# Patient Record
Sex: Female | Born: 1935 | Race: Black or African American | Hispanic: No | Smoking: Never smoker
Health system: Southern US, Community
[De-identification: ages and names within clinical notes are randomized; demographics above are authoritative.]

## PROBLEM LIST (undated history)

## (undated) DIAGNOSIS — T7840XA Allergy, unspecified, initial encounter: Secondary | ICD-10-CM

## (undated) DIAGNOSIS — E119 Type 2 diabetes mellitus without complications: Secondary | ICD-10-CM

## (undated) DIAGNOSIS — I10 Essential (primary) hypertension: Secondary | ICD-10-CM

## (undated) DIAGNOSIS — N182 Chronic kidney disease, stage 2 (mild): Secondary | ICD-10-CM

## (undated) DIAGNOSIS — M810 Age-related osteoporosis without current pathological fracture: Secondary | ICD-10-CM

## (undated) DIAGNOSIS — F0390 Unspecified dementia without behavioral disturbance: Secondary | ICD-10-CM

## (undated) DIAGNOSIS — E559 Vitamin D deficiency, unspecified: Secondary | ICD-10-CM

## (undated) DIAGNOSIS — R4182 Altered mental status, unspecified: Secondary | ICD-10-CM

## (undated) HISTORY — DX: Allergy, unspecified, initial encounter: T78.40XA

## (undated) HISTORY — DX: Age-related osteoporosis without current pathological fracture: M81.0

## (undated) HISTORY — DX: Unspecified dementia without behavioral disturbance: F03.90

## (undated) HISTORY — DX: Vitamin D deficiency, unspecified: E55.9

## (undated) HISTORY — DX: Essential (primary) hypertension: I10

## (undated) HISTORY — DX: Chronic kidney disease, stage 2 (mild): N18.2

## (undated) HISTORY — DX: Type 2 diabetes mellitus without complications: E11.9

## (undated) HISTORY — DX: Altered mental status, unspecified: R41.82

---

## 2015-03-07 DIAGNOSIS — F02818 Dementia in other diseases classified elsewhere, unspecified severity, with other behavioral disturbance: Secondary | ICD-10-CM | POA: Insufficient documentation

## 2015-03-07 DIAGNOSIS — F0390 Unspecified dementia without behavioral disturbance: Secondary | ICD-10-CM

## 2015-03-07 DIAGNOSIS — G309 Alzheimer's disease, unspecified: Secondary | ICD-10-CM

## 2015-03-07 DIAGNOSIS — R4182 Altered mental status, unspecified: Secondary | ICD-10-CM

## 2015-03-07 DIAGNOSIS — F0281 Dementia in other diseases classified elsewhere with behavioral disturbance: Secondary | ICD-10-CM | POA: Insufficient documentation

## 2015-03-07 HISTORY — DX: Unspecified dementia, unspecified severity, without behavioral disturbance, psychotic disturbance, mood disturbance, and anxiety: F03.90

## 2015-03-07 HISTORY — DX: Altered mental status, unspecified: R41.82

## 2015-11-23 ENCOUNTER — Ambulatory Visit (INDEPENDENT_AMBULATORY_CARE_PROVIDER_SITE_OTHER): Payer: Medicare PPO | Admitting: Family Medicine

## 2015-11-23 ENCOUNTER — Encounter: Payer: Self-pay | Admitting: Family Medicine

## 2015-11-23 VITALS — BP 161/79 | HR 68 | Temp 98.4°F | Resp 18 | Ht 63.5 in | Wt 125.8 lb

## 2015-11-23 DIAGNOSIS — T7840XA Allergy, unspecified, initial encounter: Secondary | ICD-10-CM

## 2015-11-23 DIAGNOSIS — N182 Chronic kidney disease, stage 2 (mild): Secondary | ICD-10-CM

## 2015-11-23 DIAGNOSIS — M810 Age-related osteoporosis without current pathological fracture: Secondary | ICD-10-CM

## 2015-11-23 DIAGNOSIS — I10 Essential (primary) hypertension: Secondary | ICD-10-CM

## 2015-11-23 DIAGNOSIS — E119 Type 2 diabetes mellitus without complications: Secondary | ICD-10-CM

## 2015-11-23 DIAGNOSIS — F0390 Unspecified dementia without behavioral disturbance: Secondary | ICD-10-CM

## 2015-11-23 LAB — CBC WITH DIFFERENTIAL/PLATELET
BASOS ABS: 0 10*3/uL (ref 0.0–0.1)
Basophils Relative: 0.5 % (ref 0.0–3.0)
EOS ABS: 0.4 10*3/uL (ref 0.0–0.7)
Eosinophils Relative: 6.5 % — ABNORMAL HIGH (ref 0.0–5.0)
HEMATOCRIT: 37.7 % (ref 36.0–46.0)
HEMOGLOBIN: 12.8 g/dL (ref 12.0–15.0)
LYMPHS PCT: 20.4 % (ref 12.0–46.0)
Lymphs Abs: 1.3 10*3/uL (ref 0.7–4.0)
MCHC: 33.9 g/dL (ref 30.0–36.0)
MCV: 88.7 fl (ref 78.0–100.0)
MONO ABS: 0.6 10*3/uL (ref 0.1–1.0)
Monocytes Relative: 8.9 % (ref 3.0–12.0)
Neutro Abs: 4 10*3/uL (ref 1.4–7.7)
Neutrophils Relative %: 63.7 % (ref 43.0–77.0)
Platelets: 211 10*3/uL (ref 150.0–400.0)
RBC: 4.24 Mil/uL (ref 3.87–5.11)
RDW: 13.8 % (ref 11.5–15.5)
WBC: 6.3 10*3/uL (ref 4.0–10.5)

## 2015-11-23 LAB — COMPREHENSIVE METABOLIC PANEL
ALBUMIN: 3.9 g/dL (ref 3.5–5.2)
ALK PHOS: 50 U/L (ref 39–117)
ALT: 9 U/L (ref 0–35)
AST: 19 U/L (ref 0–37)
BUN: 16 mg/dL (ref 6–23)
CO2: 32 mEq/L (ref 19–32)
CREATININE: 1 mg/dL (ref 0.40–1.20)
Calcium: 8.9 mg/dL (ref 8.4–10.5)
Chloride: 103 mEq/L (ref 96–112)
GFR: 68 mL/min (ref >60.00)
GLUCOSE: 93 mg/dL (ref 70–99)
POTASSIUM: 4 meq/L (ref 3.5–5.1)
SODIUM: 141 meq/L (ref 135–145)
TOTAL PROTEIN: 7 g/dL (ref 6.0–8.3)
Total Bilirubin: 0.4 mg/dL (ref 0.2–1.2)

## 2015-11-23 LAB — HEMOGLOBIN A1C: HEMOGLOBIN A1C: 6 % (ref 4.6–6.5)

## 2015-11-23 NOTE — Patient Instructions (Signed)
It was a pleasure meeting you today.  I will attempt to get all your records over the next few days.  We will get labs to today.  I will want to see you next week.  Continue taking the lisinopril you have until I get records.

## 2015-11-23 NOTE — Progress Notes (Signed)
Patient ID: Caroline Wallace, female  DOB: [birth year], 82 y.o.   MRN: [id number] Patient Care Team    Relationship Specialty Notifications Start End  Ma Hillock, DO PCP - General Family Medicine  11/23/15     Subjective:  Caroline Wallace is a 82 y.o.  female present for new patient establishment. All past medical history, surgical history, allergies, family history, immunizations, medications and social history were placed in the electronic medical record today. All recent labs, ED visits and hospitalizations within the last year were reviewed.   Patient presents with her God daughter, which is with whom she now lives since the beginning of this week. She was living with a cousin in [place], [place]. She comes in with very limited history and confusion on medications. She has two medications with her, lisinopril and metformin, both bottles over 1 years old. After pts OV today I was able to obtain last OV from her prior PCP.  Last OV at prior PCP dated 09/30/2015 was for altered  mental status/dementis new onset. Her BP at that time was normal. Her God daughter has been giving her the out of date lisinopril 30 mg the last few days, and her BP is above goal today. She performed 21/30 MMSE, referred to neurology and had a head CT with chronic changes of atrophy and  Microvascular ischemia, no acute process. She was treated for presumed UTI with Cipro. Labs cbc, cmp, tsh, UA and C/S were WNL. APS was called for the pt, uncertain if any follow through. This is likely why she is relocating to [place], and now living with her god daughter.   Medication list from that OV note included: Compliance is uncertain at this time on any of these medications.  Fosamax 70 mg q week.--> uncertain start date. Atenolol-chlorthalidone 50-25  --> pt to return to clinic in 1 week to recheck after start of lisinopril, if elevated will add agent at that time.   Asa 81--> advised to continue.  Claritin 10 mg QD-->  refilled today Metformin ER 500 mg 1 PO QD--> E0F 6.0, uncertain if she is needing this script or if she had been taking since old script with her today.  Os-cal 500 + D3--> advised to continue  simvastatin 20 mg QD --> Will obtain fasting lipids.  Vit D3 5000 u QD--> Vit d needs tested, would lower dose to < 4000u daily.  Lisinopril 30 mg QD--> called in new script today.   Health maintenance: Unknown history.  Assistive device: None Oxygen HQR:FXJO Patient has a Dental home. Hospitalizations/ED visits:  None   There is no immunization history on file for this patient.   Past Medical History:  Diagnosis Date  . Allergy   . Altered mental status 2017  . CKD (chronic kidney disease), stage II   . Dementia 2017  . Diabetes mellitus without complication ([place])   . Hypertension   . Osteoporosis    Allergies  Allergen Reactions  . Penicillins Hives  . Fruit & Vegetable Daily [Nutritional Supplements] Rash   History reviewed. No pertinent surgical history. Family History  Problem Relation Age of Onset  . Lung cancer Mother    Social History   Social History  . Marital status: Widowed    Spouse name: N/A  . Number of children: N/A  . Years of education: N/A   Occupational History  . Not on file.   Social History Main Topics  . Smoking status: Never Smoker  .  Smokeless tobacco: Never Used  . Alcohol use No  . Drug use: No  . Sexual activity: No   Other Topics Concern  . Not on file   Social History Narrative   Widowed.    [employer].    Lives with her God daughter   HS diploma. Retired Primary school teacher.    No ETOH, Tobacco or drug use.    Wears her seatbelt. Smoke detector in the home.      Medication List       Accurate as of 11/23/15 11:59 PM. Always use your most recent med list.          lisinopril 30 MG tablet Commonly known as:  PRINIVIL,ZESTRIL Take 1 tablet (30 mg total) by mouth daily.   loratadine 10 MG tablet Commonly known as:   CLARITIN Take 1 tablet (10 mg total) by mouth daily.   metFORMIN 500 MG 24 hr tablet Commonly known as:  GLUCOPHAGE-XR Take 500 mg by mouth daily with breakfast.        Recent Results (from the past 2160 hour(s))  CBC w/Diff     Status: Abnormal   Collection Time: 11/23/15  2:57 PM  Result Value Ref Range   WBC 6.3 4.0 - 10.5 K/uL   RBC 4.24 3.87 - 5.11 Mil/uL   Hemoglobin 12.8 12.0 - 15.0 g/dL   HCT 37.7 36.0 - 46.0 %   MCV 88.7 78.0 - 100.0 fl   MCHC 33.9 30.0 - 36.0 g/dL   RDW 13.8 11.5 - 15.5 %   Platelets 211.0 150.0 - 400.0 K/uL   Neutrophils Relative % 63.7 43.0 - 77.0 %   Lymphocytes Relative 20.4 12.0 - 46.0 %   Monocytes Relative 8.9 3.0 - 12.0 %   Eosinophils Relative 6.5 (H) 0.0 - 5.0 %   Basophils Relative 0.5 0.0 - 3.0 %   Neutro Abs 4.0 1.4 - 7.7 K/uL   Lymphs Abs 1.3 0.7 - 4.0 K/uL   Monocytes Absolute 0.6 0.1 - 1.0 K/uL   Eosinophils Absolute 0.4 0.0 - 0.7 K/uL   Basophils Absolute 0.0 0.0 - 0.1 K/uL  HgB A1c     Status: None   Collection Time: 11/23/15  2:57 PM  Result Value Ref Range   Hgb A1c MFr Bld 6.0 4.6 - 6.5 %    Comment: Glycemic Control Guidelines for People with Diabetes:Non Diabetic:  <6%Goal of Therapy: <7%Additional Action Suggested:  >8%   Comp Met (CMET)     Status: None   Collection Time: 11/23/15  2:57 PM  Result Value Ref Range   Sodium 141 135 - 145 mEq/L   Potassium 4.0 3.5 - 5.1 mEq/L   Chloride 103 96 - 112 mEq/L   CO2 32 19 - 32 mEq/L   Glucose, Bld 93 70 - 99 mg/dL   BUN 16 6 - 23 mg/dL   Creatinine, Ser 1.00 0.40 - 1.20 mg/dL   Total Bilirubin 0.4 0.2 - 1.2 mg/dL   Alkaline Phosphatase 50 39 - 117 U/L   AST 19 0 - 37 U/L   ALT 9 0 - 35 U/L   Total Protein 7.0 6.0 - 8.3 g/dL   Albumin 3.9 3.5 - 5.2 g/dL   Calcium 8.9 8.4 - 10.5 mg/dL   GFR 68.00 >60.00 mL/min    Patient was never admitted.   ROS: 14 pt review of systems performed and negative (unless mentioned in an HPI)  Objective: BP (!) 161/79 (BP Location:  Right Arm, Patient Position: Sitting,  Cuff Size: Normal)   Pulse 68   Temp 98.4 F (36.9 C)   Resp 18   Ht 5' 3.5" (1.613 m)   Wt 125 lb 12 oz (57 kg)   SpO2 96%   BMI 21.93 kg/m  Gen: Afebrile. No acute distress. Nontoxic in appearance, well-developed, well-nourished, very pleasant AAF. Mild-moderate dementia. Very well groomed HENT: AT. [place]. MMM Eyes:Pupils Equal Round Reactive to light, Extraocular movements intact,  Conjunctiva without redness, discharge or icterus. Neck/lymp/endocrine: Supple,no lymphadenopathy CV: RRR, no edema Chest: CTAB, no wheeze, rhonchi or crackles.  Abd: Soft. NTND. BS present Skin:  Warm and well-perfused. Skin intact. Neuro/Msk: short stride, forward head carriagel gait. PERLA. EOMi. Alert. Oriented. Psych: Normal affect, dress and demeanor. Normal speech. Mild-moderate dementia, no behavior disturbances.   Assessment/plan: Caroline Wallace is a 82 y.o. female present for establishment of care. By the once OV received and the lack of current prescriptions with pt, I am uncertain if she needs some of these medications. It is possible she has not been taking them for some time.  Fosamax 70 mg q week.--> uncertain start date. Atenolol-chlorthalidone 50-25  --> pt to return to clinic in 1 week to recheck after start of lisinopril, if elevated will add agent at that time.   Asa 81--> advised to continue.  Claritin 10 mg QD--> refilled today Metformin ER 500 mg 1 PO QD--> P5V 6.0, uncertain if she is needing this script or if she had been taking since old script with her today.  Os-cal 500 + D3--> advised to continue  simvastatin 20 mg QD --> Will obtain fasting lipids.  Vit D3 5000 u QD--> Vit d needs tested, would lower dose to < 4000u daily.  Lisinopril 30 mg QD--> called in new script today.  1 week follow up.   Greater than 45 minutes was spent with patient, greater than 50% of that time was spent face-to-face with patient counseling and coordinating  care.  Return in about 1 week (around 11/30/2015), or chronic medical condtions. .  Electronically signed by: Howard Pouch, DO [place]

## 2015-11-25 ENCOUNTER — Telehealth: Payer: Self-pay | Admitting: Family Medicine

## 2015-11-25 ENCOUNTER — Encounter: Payer: Self-pay | Admitting: Family Medicine

## 2015-11-25 DIAGNOSIS — T7840XA Allergy, unspecified, initial encounter: Secondary | ICD-10-CM | POA: Insufficient documentation

## 2015-11-25 DIAGNOSIS — N183 Chronic kidney disease, stage 3 unspecified: Secondary | ICD-10-CM | POA: Insufficient documentation

## 2015-11-25 DIAGNOSIS — M810 Age-related osteoporosis without current pathological fracture: Secondary | ICD-10-CM | POA: Insufficient documentation

## 2015-11-25 DIAGNOSIS — E119 Type 2 diabetes mellitus without complications: Secondary | ICD-10-CM | POA: Insufficient documentation

## 2015-11-25 DIAGNOSIS — I10 Essential (primary) hypertension: Secondary | ICD-10-CM | POA: Insufficient documentation

## 2015-11-25 MED ORDER — LISINOPRIL 30 MG PO TABS: 30.0000 mg | ORAL_TABLET | Freq: Every day | ORAL | 1 refills | Status: DC

## 2015-11-25 MED ORDER — LORATADINE 10 MG PO TABS: 10.0000 mg | ORAL_TABLET | Freq: Every day | ORAL | 3 refills | Status: AC

## 2015-11-25 NOTE — Telephone Encounter (Signed)
Please call pts god daughter Olegario MessierKathy: - Ms. Arvanitis labs good. I will go into detail during their OV next week.  - I have received her last OV note in July, from prior PCP. It is not much, but at least we have something to go off of.  - After reviewing those notes, I have called in her Claritin (allergy) and lisinopril 30 mg QD (blood pressure) medicines. I would like them to through out all old scripts, and start the new lisinopril immediately.  - We will discuss other medications on her visit next week.

## 2015-11-26 MED FILL — LISINOPRIL 30 MG TABLET: 30 | 90 days supply | Qty: 90 | Fill #0

## 2015-11-26 NOTE — Telephone Encounter (Signed)
Spoke with patient's God Daughter reviewed information and instructions. She verbalized understanding.

## 2015-12-03 ENCOUNTER — Ambulatory Visit (INDEPENDENT_AMBULATORY_CARE_PROVIDER_SITE_OTHER): Payer: Medicare PPO | Admitting: Family Medicine

## 2015-12-03 ENCOUNTER — Encounter: Payer: Self-pay | Admitting: Family Medicine

## 2015-12-03 VITALS — BP 163/81 | HR 69 | Temp 98.0°F | Resp 20 | Wt 137.0 lb

## 2015-12-03 DIAGNOSIS — L609 Nail disorder, unspecified: Secondary | ICD-10-CM

## 2015-12-03 DIAGNOSIS — L608 Other nail disorders: Secondary | ICD-10-CM

## 2015-12-03 DIAGNOSIS — I10 Essential (primary) hypertension: Secondary | ICD-10-CM

## 2015-12-03 DIAGNOSIS — E119 Type 2 diabetes mellitus without complications: Secondary | ICD-10-CM

## 2015-12-03 DIAGNOSIS — E559 Vitamin D deficiency, unspecified: Secondary | ICD-10-CM | POA: Diagnosis not present

## 2015-12-03 DIAGNOSIS — R059 Cough, unspecified: Secondary | ICD-10-CM

## 2015-12-03 DIAGNOSIS — M2040 Other hammer toe(s) (acquired), unspecified foot: Secondary | ICD-10-CM | POA: Insufficient documentation

## 2015-12-03 DIAGNOSIS — R05 Cough: Secondary | ICD-10-CM

## 2015-12-03 DIAGNOSIS — E785 Hyperlipidemia, unspecified: Secondary | ICD-10-CM | POA: Diagnosis not present

## 2015-12-03 DIAGNOSIS — M2041 Other hammer toe(s) (acquired), right foot: Secondary | ICD-10-CM

## 2015-12-03 LAB — LDL CHOLESTEROL, DIRECT: Direct LDL: 138 mg/dL — ABNORMAL HIGH (ref <130)

## 2015-12-03 MED ORDER — CHLORTHALIDONE 25 MG PO TABS: 25.0000 mg | ORAL_TABLET | Freq: Every day | ORAL | 0 refills | Status: DC

## 2015-12-03 MED FILL — CHLORTHALIDONE 25 MG TABLET: 25 | 30 days supply | Qty: 30 | Fill #0

## 2015-12-03 NOTE — Patient Instructions (Addendum)
Cough: Mucinex plain or DM for cough. The DM portion is robitussin.  Continue the Lisinopril 30 mg every day.  Start the new BP med which is a mild water pill. She was on this before. Follow up 1 month, unless BP still > 150/90, then will need to see sooner.  I am checking Vit d today, to advise on supplement, she use to be on a rather high dose. Also checking lipid today.  I am still holding the metformin (diabetes) because her level was ok and we are not certain she was taking. Will recheck in 3 months.  If able to tolerate baby aspirin she should take 1 daily with food.   I have referred you to podiatry, [facility] by request.

## 2015-12-03 NOTE — Progress Notes (Signed)
Patient ID: Caroline Wallace, female  DOB: January 15, 1931, 82 y.o.   MRN: [id number] Patient Care Team    Relationship Specialty Notifications Start End  Ma Hillock, DO PCP - General Family Medicine  11/23/15     Subjective:  Caroline Wallace is a 82 y.o.  female present for Follow-up to hypertension and chronic medical conditions. Patient presented for establishment visit last week, with obvious dementia. Full medical history/medications etc. was unable to be obtained. She presented with her goddaughter, with whom she has just recently moved in with, who also didn't have any information on the patient's medical conditions. Lab work was obtained on that visit with a normal CMP, CBC. A1c was 6.0. We have received at least a few visits faxed to [place] from her prior provider, full records have yet to be received. Patient was restarted on lisinopril 30 mg, and she has tolerated well. She is also taking an allergy/Claritin medication for increased allergies.   Prior note: Patient presents with her God daughter, which is with whom she now lives since the beginning of this week. She was living with a cousin in [place], [place]. She comes in with very limited history and confusion on medications. She has two medications with her, lisinopril and metformin, both bottles over 14 years old. After pts OV today I was able to obtain last OV from her prior PCP.  Last OV at prior PCP dated 09/30/2015 was for altered  mental status/dementis new onset. Her BP at that time was normal. Her God daughter has been giving her the out of date lisinopril 30 mg the last few days, and her BP is above goal today. She performed 21/30 MMSE, referred to neurology and had a head CT with chronic changes of atrophy and  Microvascular ischemia, no acute process. She was treated for presumed UTI with Cipro. Labs cbc, cmp, tsh, UA and C/S were WNL. APS was called for the pt, uncertain if any follow through. This is likely why she is relocating  to [place], and now living with her god daughter.   Medication list from her PCP note: Compliance is uncertain at this time on any of these medications.  Fosamax 70 mg q week.--> uncertain start date. Atenolol-chlorthalidone 50-25  --> pt to return to clinic in 1 week to recheck after start of lisinopril, if elevated will add agent at that time.   Asa 81--> advised to continue.  Claritin 10 mg QD--> refilled today Metformin ER 500 mg 1 PO QD--> P5X 6.0, uncertain if she is needing this script or if she had been taking since old script with her today.  Os-cal 500 + D3--> advised to continue  simvastatin 20 mg QD --> Will obtain fasting lipids.  Vit D3 5000 u QD--> Vit d needs tested, would lower dose to < 4000u daily.  Lisinopril 30 mg QD--> called in new script today.     There is no immunization history on file for this patient.   Past Medical History:  Diagnosis Date  . Allergy   . Altered mental status 2017  . CKD (chronic kidney disease), stage II   . Dementia 2017  . Diabetes mellitus without complication ([place])   . Hypertension   . Osteoporosis    Allergies  Allergen Reactions  . Penicillins Hives  . Fruit & Vegetable Daily [Nutritional Supplements] Rash   No past surgical history on file. Family History  Problem Relation Age of Onset  . Lung cancer  Mother    Social History   Social History  . Marital status: Widowed    Spouse name: N/A  . Number of children: N/A  . Years of education: N/A   Occupational History  . Not on file.   Social History Main Topics  . Smoking status: Never Smoker  . Smokeless tobacco: Never Used  . Alcohol use No  . Drug use: No  . Sexual activity: No   Other Topics Concern  . Not on file   Social History Narrative   Widowed.    [employer].    Lives with her God daughter   HS diploma. Retired Primary school teacher.    No ETOH, Tobacco or drug use.    Wears her seatbelt. Smoke detector in the home.      Medication List         Accurate as of 12/03/15  2:56 PM. Always use your most recent med list.          lisinopril 30 MG tablet Commonly known as:  PRINIVIL,ZESTRIL Take 1 tablet (30 mg total) by mouth daily.   loratadine 10 MG tablet Commonly known as:  CLARITIN Take 1 tablet (10 mg total) by mouth daily.   metFORMIN 500 MG 24 hr tablet Commonly known as:  GLUCOPHAGE-XR Take 500 mg by mouth daily with breakfast.        Recent Results (from the past 2160 hour(s))  CBC w/Diff     Status: Abnormal   Collection Time: 11/23/15  2:57 PM  Result Value Ref Range   WBC 6.3 4.0 - 10.5 K/uL   RBC 4.24 3.87 - 5.11 Mil/uL   Hemoglobin 12.8 12.0 - 15.0 g/dL   HCT 37.7 36.0 - 46.0 %   MCV 88.7 78.0 - 100.0 fl   MCHC 33.9 30.0 - 36.0 g/dL   RDW 13.8 11.5 - 15.5 %   Platelets 211.0 150.0 - 400.0 K/uL   Neutrophils Relative % 63.7 43.0 - 77.0 %   Lymphocytes Relative 20.4 12.0 - 46.0 %   Monocytes Relative 8.9 3.0 - 12.0 %   Eosinophils Relative 6.5 (H) 0.0 - 5.0 %   Basophils Relative 0.5 0.0 - 3.0 %   Neutro Abs 4.0 1.4 - 7.7 K/uL   Lymphs Abs 1.3 0.7 - 4.0 K/uL   Monocytes Absolute 0.6 0.1 - 1.0 K/uL   Eosinophils Absolute 0.4 0.0 - 0.7 K/uL   Basophils Absolute 0.0 0.0 - 0.1 K/uL  HgB A1c     Status: None   Collection Time: 11/23/15  2:57 PM  Result Value Ref Range   Hgb A1c MFr Bld 6.0 4.6 - 6.5 %    Comment: Glycemic Control Guidelines for People with Diabetes:Non Diabetic:  <6%Goal of Therapy: <7%Additional Action Suggested:  >8%   Comp Met (CMET)     Status: None   Collection Time: 11/23/15  2:57 PM  Result Value Ref Range   Sodium 141 135 - 145 mEq/L   Potassium 4.0 3.5 - 5.1 mEq/L   Chloride 103 96 - 112 mEq/L   CO2 32 19 - 32 mEq/L   Glucose, Bld 93 70 - 99 mg/dL   BUN 16 6 - 23 mg/dL   Creatinine, Ser 1.00 0.40 - 1.20 mg/dL   Total Bilirubin 0.4 0.2 - 1.2 mg/dL   Alkaline Phosphatase 50 39 - 117 U/L   AST 19 0 - 37 U/L   ALT 9 0 - 35 U/L   Total Protein 7.0 6.0 - 8.3 g/dL  Albumin 3.9 3.5 - 5.2 g/dL   Calcium 8.9 8.4 - 10.5 mg/dL   GFR 68.00 >60.00 mL/min    Patient was never admitted.   ROS: 14 pt review of systems performed and negative (unless mentioned in an HPI)  Objective: BP (!) 163/81 (BP Location: Left Arm, Patient Position: Sitting, Cuff Size: Normal)   Pulse 69   Temp 98 F (36.7 C)   Resp 20   Wt 137 lb (62.1 kg)   SpO2 95%   BMI 23.89 kg/m  Gen: Afebrile. No acute distress. Nontoxic in appearance, well-developed, well-nourished, very pleasant AAF. moderate dementia- did not remember seeing me last week. Very well groomed HENT: AT. [place]. MMM Eyes:Pupils Equal Round Reactive to light, Extraocular movements intact,  Conjunctiva without redness, discharge or icterus. Neck/lymp/endocrine: Supple,no lymphadenopathy CV: RRR, no edema Chest: CTAB, no wheeze, rhonchi or crackles.  Abd: Soft. NTND. BS present Skin:  Warm and well-perfused. Skin intact. Neuro/Msk: short stride, forward head carriagel gait. PERLA. EOMi. Alert. Oriented. Psych: Normal affect, dress and demeanor. Normal speech. Mild-moderate dementia, no behavior disturbances.  Diabetic Foot Exam - Simple   Simple Foot Form Diabetic Foot exam was performed with the following findings:  Yes 12/03/2015  3:28 PM  Visual Inspection See comments:  Yes Sensation Testing Intact to touch and monofilament testing bilaterally:  Yes Pulse Check Posterior Tibialis and Dorsalis pulse intact bilaterally:  Yes Comments Patient with extremely long thickened toenails on all digits. Right second toe with hammertoe/callus formation. Left foot with second/third toe overlapping.      Assessment/plan: Caroline Wallace is a 82 y.o. female present for follow-up on establishment visit/hypertension and chronic medical conditions. Hypertension hyperlipidemia:  - Unfortunately patient presented with a different family member today, which did not seem to know much of her medical history either, or her  current management. After a phone call made to her God daughter, we were able to verify that she has been taking the lisinopril 30 mg daily. - Do not see the benefit of patient taking a statin any longer, giving age and moderate dementia setting. Dietary and exercise modification will be encouraged. -  Her blood pressure is elevated above goal today, will add the chlorthalidone 25 mg that she reportedly was on by her prior PCP.  - Patient's god aughter is a Marine scientist, she is to monitor her blood pressure and write them down for [place]. These will be obtained when patient is called on the results of her lab work. -  If they are within normal range follow-up in 3 months, if not range will taper up on the lisinopril and will need to see back in 2 weeks. Vitamin D deficiency/osteoporosis: vitamin D level today, patient will likely benefit from thousand units daily. Will wait to restart Fosamax, if at all. Diabetes:  - A1c 6.0, have decided to hold her metformin seeing how she was on a very low-dose and uncertain if taking. We'll recheck A1c in 3 months. - Diabetic foot exam today, patient is in desperate need of a podiatrist. This referral has been placed for her today.  Follow-up dependent on home blood pressure readings after starting new medication. If within range will follow-up in 3 months, if blood pressure is still elevated we'll need to adjust medication and see back in 2 weeks.  > 25 minutes spent with patient, >50% of time spent face to face counseling patient and coordinating care.   Electronically signed by: Howard Pouch, DO [employer]

## 2015-12-04 LAB — VITAMIN D 25 HYDROXY (VIT D DEFICIENCY, FRACTURES): VIT D 25 HYDROXY: 30 ng/mL (ref 30–100)

## 2015-12-06 ENCOUNTER — Telehealth: Payer: Self-pay | Admitting: Family Medicine

## 2015-12-06 NOTE — Telephone Encounter (Signed)
Patient's God Daughter aware of results.  Appt scheduled 12/08/15.

## 2015-12-06 NOTE — Telephone Encounter (Signed)
Please call pts god daughter: - her vit d is on low end normal. I would recommend they supplement with 1000 untis daily of Vit D with a meal.  - please make certain they made an appt this week to recheck BP with provider visit since the start of more medicine.

## 2015-12-08 ENCOUNTER — Other Ambulatory Visit: Payer: Self-pay | Admitting: Family Medicine

## 2015-12-08 ENCOUNTER — Encounter: Payer: Self-pay | Admitting: Family Medicine

## 2015-12-08 ENCOUNTER — Ambulatory Visit: Payer: Medicare PPO | Admitting: Family Medicine

## 2015-12-08 ENCOUNTER — Ambulatory Visit (INDEPENDENT_AMBULATORY_CARE_PROVIDER_SITE_OTHER): Payer: Medicare PPO | Admitting: Family Medicine

## 2015-12-08 VITALS — BP 97/63 | HR 80 | Temp 98.1°F | Resp 16 | Wt 135.1 lb

## 2015-12-08 DIAGNOSIS — F0391 Unspecified dementia with behavioral disturbance: Secondary | ICD-10-CM

## 2015-12-08 DIAGNOSIS — Z8639 Personal history of other endocrine, nutritional and metabolic disease: Secondary | ICD-10-CM | POA: Diagnosis not present

## 2015-12-08 DIAGNOSIS — Z23 Encounter for immunization: Secondary | ICD-10-CM

## 2015-12-08 DIAGNOSIS — I10 Essential (primary) hypertension: Secondary | ICD-10-CM | POA: Diagnosis not present

## 2015-12-08 MED ORDER — CHLORTHALIDONE 25 MG PO TABS: ORAL_TABLET | ORAL | 0 refills | Status: DC

## 2015-12-08 MED ORDER — TEMAZEPAM 15 MG PO CAPS: ORAL_CAPSULE | ORAL | 1 refills | Status: DC

## 2015-12-08 MED ORDER — TEMAZEPAM 7.5 MG PO CAPS: 7.5000 mg | ORAL_CAPSULE | Freq: Every evening | ORAL | 1 refills | Status: DC | PRN

## 2015-12-08 NOTE — Progress Notes (Signed)
OFFICE VISIT  12/08/2015   CC:  Chief Complaint  Patient presents with  . Follow-up    HTN   HPI:    Patient is a 82 y.o. African-American female who presents accompanied by her god daughter for f/u HTN and dementia. Has been on 30mg  lisinopril, then 25mg  chlorthalidone added about a week ago.   She feels good.  No home bp checks.  Denies any fatigue or orthostatic dizziness.  She initiates sleep fine.  However, she is noted to wake up in middle of night and has been found wandering around her room.  She doesn't leave her room.  She says she gets up to use the bathroom and then goes back to bed. This has been happening nightly for the last 2 weeks per god daughter.   When staying with a different person recently she did in fact wander from the home, attempting to "get back home".  Caregiver also says she now recalls that pt had hypothyroidism and she thinks she may have been on synthroid in the past.     Past Medical History:  Diagnosis Date  . Allergy   . Altered mental status 2017  . CKD (chronic kidney disease), stage II   . Dementia 2017  . Diabetes mellitus without complication ([facility])   . Hypertension   . Osteoporosis     History reviewed. No pertinent surgical history.  Outpatient Medications Prior to Visit  Medication Sig Dispense Refill  . lisinopril (PRINIVIL,ZESTRIL) 30 MG tablet Take 1 tablet (30 mg total) by mouth daily. 90 tablet 1  . loratadine (CLARITIN) 10 MG tablet Take 1 tablet (10 mg total) by mouth daily. 90 tablet 3  . chlorthalidone (HYGROTON) 25 MG tablet Take 1 tablet (25 mg total) by mouth daily. 30 tablet 0  . metFORMIN (GLUCOPHAGE-XR) 500 MG 24 hr tablet Take 500 mg by mouth daily with breakfast.     No facility-administered medications prior to visit.     Allergies  Allergen Reactions  . Penicillins Hives  . Fruit & Vegetable Daily [Nutritional Supplements] Rash    ROS As per HPI  PE: Blood pressure 97/63, pulse 80, temperature 98.1  F (36.7 C), temperature source Oral, resp. rate 16, weight 135 lb 1.9 oz (61.3 kg), SpO2 96 %. Gen: Alert, well appearing.  Patient is oriented to person, place, time, and situation. CV: RRR, no m/r/g.   LUNGS: CTA bilat, nonlabored resps, good aeration in all lung fields. EXT: trace to 1+ pitting edema bilat LLs.  LABS:    Chemistry      Component Value Date/Time   NA 141 11/23/2015 1457   K 4.0 11/23/2015 1457   CL 103 11/23/2015 1457   CO2 32 11/23/2015 1457   BUN 16 11/23/2015 1457   CREATININE 1.00 11/23/2015 1457      Component Value Date/Time   CALCIUM 8.9 11/23/2015 1457   ALKPHOS 50 11/23/2015 1457   AST 19 11/23/2015 1457   ALT 9 11/23/2015 1457   BILITOT 0.4 11/23/2015 1457       IMPRESSION AND PLAN:  1) HTN: bp a bit too low now, but pt asymptomatic from this. Will have her continue lisinopril 30mg  qd but cut chlorthalidone 25mg  tab in half daily. Check BMET today. Instructions:  Check blood pressure once daily and write numbers down for review at next follow up with Dr. Claiborne Billings.  2) Dementia with behav disturbance: up in middle of night but not wandering out of her room.  She tries  to get dressed and ready for her day at 3 AM most nights.  Her God daughter is a case Production designer, theatre/television/filmmanager at [employer] and says she does not think a HH/home safety eval is needed at this time.  She is trying to get pt into the [facility] program (adult daycare). As far as her night-time awakening and wandering in her room, I talked with caregiver today and we decided on a trial of restoril 7.5mg  qhs prn.  I clearly outlined the potential risks of this medication in this situation and caregiver expressed understanding.  3) history of hypothyroidism: unclear.  Check TSH, T4 and T3 today.  An After Visit Summary was printed and given to the patient.  FOLLOW UP: Return in about 2 weeks (around 12/22/2015) for f/u HTN and dementia.  Signed:  Santiago BumpersPhil Tomothy Eddins, MD           12/08/2015

## 2015-12-08 NOTE — Progress Notes (Signed)
Pre visit review using our clinic review tool, if applicable. No additional management support is needed unless otherwise documented below in the visit note. 

## 2015-12-08 NOTE — Addendum Note (Signed)
Addended by: Regan RakersAY, LAURA K on: 12/08/2015 03:32 PM   Modules accepted: Orders

## 2015-12-08 NOTE — Patient Instructions (Signed)
Take 1/2 of Chlorthalidone tab once daily.  Check blood pressure once daily and write numbers down for review at next follow up with Dr. Claiborne BillingsKuneff.

## 2015-12-09 ENCOUNTER — Encounter: Payer: Self-pay | Admitting: Family Medicine

## 2015-12-09 ENCOUNTER — Telehealth: Payer: Self-pay | Admitting: *Deleted

## 2015-12-09 LAB — BASIC METABOLIC PANEL
BUN: 30 mg/dL — AB (ref 6–23)
CHLORIDE: 101 meq/L (ref 96–112)
CO2: 30 mEq/L (ref 19–32)
Calcium: 9.2 mg/dL (ref 8.4–10.5)
Creatinine, Ser: 1.23 mg/dL — ABNORMAL HIGH (ref 0.40–1.20)
GFR: 53.54 mL/min — AB (ref >60.00)
GLUCOSE: 89 mg/dL (ref 70–99)
POTASSIUM: 4.1 meq/L (ref 3.5–5.1)
SODIUM: 138 meq/L (ref 135–145)

## 2015-12-09 LAB — TSH: TSH: 1.81 u[IU]/mL (ref 0.35–4.50)

## 2015-12-09 LAB — T3: T3, Total: 98 ng/dL (ref 76–181)

## 2015-12-09 LAB — T4, FREE: Free T4: 0.83 ng/dL (ref 0.60–1.60)

## 2015-12-09 MED ORDER — TRAZODONE HCL 50 MG PO TABS: 50.0000 mg | ORAL_TABLET | Freq: Every evening | ORAL | 1 refills | Status: DC | PRN

## 2015-12-09 MED FILL — traZODone HCL 50 MG TABS: 50 | 30 days supply | Qty: 30 | Fill #0

## 2015-12-09 NOTE — Telephone Encounter (Signed)
Pharmacy called they state they cannot fill restoril as ordered because it only comes in capsule form it is ordered as 1/2 a tab. Per Dr Milinda CaveMcgowen change Rx to Trazadone 50mg  tab 1 po Qhs prn sleep.

## 2015-12-13 ENCOUNTER — Telehealth: Payer: Self-pay | Admitting: *Deleted

## 2015-12-13 NOTE — Telephone Encounter (Signed)
Patient's God Daughter called she states patient's BP has been dropping since last medication adjustment. She is concerned it is dropping to low. Readings are as follows 10/5 -111/65 10/6 111/72, 10/7 107/64, 10/8-97/57 and 10/9- 95/51. Please advise

## 2015-12-13 NOTE — Telephone Encounter (Signed)
It appears at her last appt it was lowered to 12.5 mg of chlorthalidone (1/2 tab). Make certain they are correctly dosing. If they are already halving the chlorthalidone, then she will need to discontinue this medication all together.  - Continue monitoring BP daily.  Make sure she is maintaining  Hydration.  - will keep follow up the same (oct. 17).

## 2015-12-14 ENCOUNTER — Ambulatory Visit (INDEPENDENT_AMBULATORY_CARE_PROVIDER_SITE_OTHER): Payer: Medicare PPO | Admitting: Podiatry

## 2015-12-14 ENCOUNTER — Encounter: Payer: Self-pay | Admitting: Podiatry

## 2015-12-14 DIAGNOSIS — M79675 Pain in left toe(s): Secondary | ICD-10-CM

## 2015-12-14 DIAGNOSIS — M79674 Pain in right toe(s): Secondary | ICD-10-CM | POA: Diagnosis not present

## 2015-12-14 DIAGNOSIS — B351 Tinea unguium: Secondary | ICD-10-CM

## 2015-12-14 NOTE — Progress Notes (Signed)
   Subjective:    Patient ID: Caroline Wallace, female    DOB: [birth year], 82 y.o.   MRN: [id number]  HPI  82 year old female presents the office today for concerns of thick, painful, elongated toenails that she cannot trim herself. Denies any redness or drainage coming from the area. Denies any swelling to her feet. Denies any claudication symptoms. No other complaints at this time.  Last A1c was 6  Review of Systems  All other systems reviewed and are negative.      Objective:   Physical Exam General: AAO x3, NAD  Dermatological: Nails are hypertrophic, dystrophic, brittle, discolored, elongated 10. No surrounding redness or drainage. Tenderness nails 1-5 bilaterally. The right hallux toenail is loose and the underlying nail bed and only The Proximal Nail Border. No open lesions or pre-ulcerative lesions are identified today.   Vascular: DP pulses 2/4 and PT pulses 1/4. CRT less than 3 seconds. There is no pain with calf compression, swelling, warmth, erythema.   Neruologic: Grossly intact via light touch bilateral. Vibratory intact via tuning fork bilateral. Protective threshold with Semmes Wienstein monofilament intact to all pedal sites bilateral.   Musculoskeletal: No gross boney pedal deformities bilateral. No pain, crepitus, or limitation noted with foot and ankle range of motion bilateral. Muscular strength 5/5 in all groups tested bilateral.  Gait: Unassisted, Nonantalgic.     Assessment & Plan:  82 year old female with symptomatic onychomycosis -Treatment options discussed including all alternatives, risks, and complications -Etiology of symptoms were discussed -Nails debrided 10 without complications or bleeding. -Daily foot inspection -Follow-up in 3 months or sooner if any problems arise. In the meantime, encouraged to call the office with any questions, concerns, change in symptoms.   Ovid CurdMatthew Wagoner, DPM

## 2015-12-14 NOTE — Telephone Encounter (Signed)
Spoke with Olegario MessierKathy she said they have been only giving 1/2 tab of chlorthalidone to patient. Instructed her to discontinue chlorthalidone and monitor BP. Keep follow up appt for 12/21/15.

## 2015-12-15 ENCOUNTER — Telehealth: Payer: Self-pay | Admitting: *Deleted

## 2015-12-15 NOTE — Telephone Encounter (Signed)
Patient goddaughter called and requested MRI for evaluation of Dementia . Spoke with Dr Claiborne BillingsKuneff she will address this issue at patient next follow up appt. Informed patient god daughter she verbalized understanding.

## 2015-12-21 ENCOUNTER — Ambulatory Visit (HOSPITAL_BASED_OUTPATIENT_CLINIC_OR_DEPARTMENT_OTHER)
Admission: RE | Admit: 2015-12-21 | Discharge: 2015-12-21 | Disposition: A | Discharge status: Home / Self Care | Payer: Medicare PPO | Source: Ambulatory Visit | Attending: Family Medicine | Admitting: Family Medicine

## 2015-12-21 ENCOUNTER — Encounter: Payer: Self-pay | Admitting: Family Medicine

## 2015-12-21 ENCOUNTER — Ambulatory Visit (INDEPENDENT_AMBULATORY_CARE_PROVIDER_SITE_OTHER): Payer: Medicare PPO | Admitting: Family Medicine

## 2015-12-21 VITALS — BP 137/67 | HR 75 | Temp 98.0°F | Resp 20 | Wt 140.0 lb

## 2015-12-21 DIAGNOSIS — R600 Localized edema: Secondary | ICD-10-CM

## 2015-12-21 DIAGNOSIS — F0390 Unspecified dementia without behavioral disturbance: Secondary | ICD-10-CM | POA: Diagnosis not present

## 2015-12-21 DIAGNOSIS — R059 Cough, unspecified: Secondary | ICD-10-CM

## 2015-12-21 DIAGNOSIS — R053 Chronic cough: Secondary | ICD-10-CM

## 2015-12-21 DIAGNOSIS — E119 Type 2 diabetes mellitus without complications: Secondary | ICD-10-CM

## 2015-12-21 DIAGNOSIS — F513 Sleepwalking [somnambulism]: Secondary | ICD-10-CM | POA: Insufficient documentation

## 2015-12-21 DIAGNOSIS — I10 Essential (primary) hypertension: Secondary | ICD-10-CM | POA: Diagnosis not present

## 2015-12-21 DIAGNOSIS — R05 Cough: Secondary | ICD-10-CM | POA: Insufficient documentation

## 2015-12-21 NOTE — Patient Instructions (Signed)
I have ordered the chest xray. I will call once it is completed.  I will place referral for neuropsych and home health eval Continue lisinopril 30 mg daily.

## 2015-12-21 NOTE — Progress Notes (Signed)
Patient ID: Caroline Wallace, female  DOB: 03/20/1930, 82 y.o.   MRN: [id number] Patient Care Team    Relationship Specialty Notifications Start End  Ma Hillock, DO PCP - General Family Medicine  11/23/15     Subjective:  Chronic Cough: pt has had a chronic cough since she moved in > 4 weeks ago with her God daughter. She was tried on Claritin and mucinex DM which seemed to help, however cough has returned. The last 3 days the family has noticed a small amount of blood tinged sputum in the morning with the cough. There has been no report of fever, chills, nausea, vomit or increase in fatigue. She has never smoked. She has been exposed to mold recently in her home (which was condemned). She is on lisinopril.   Hypertension: pt is now taking lisinopril 30 mg and BP readings at home in the morning are normal to low normal. She denies dizziness or fatigue. Her afternoon BP readings in this office are normal today. Chlorthalidone has been discontinued, her BP was too low even with 12.5 mg dosing. She reports some mild LE edema, but states she thinks it was worse with the chlorthalidone. She does not have compression stockings. Pt does denies headache, dizziness, fatigue, chest pain, or shortness of breath.  Insomnia: Pt has been started on trazodone for insomnia and night time wandering. This has helped with the sleeping and pt tolerating well.   Dementia: Uncertain history surrounding dementia onset. Minimal records received from prior PCP. MMSE at that time 21/30.  Questionable onset earlier this summer, however history of patients uncontrolled conditions and unknown medications/history, outdated medication bottles of 2 years,  etc would suggest this has been going on for some time. She is now living with her God daughter (and her husband) and seems happy staying there with her. They have registered her for the [facility] program and if accepted, will be able to start this January. Pt is still wandering  in the early hours of the mornings. The family is attempting to make their home as safe as possible with stove covers etc. They are agreeable to home safety evaluation. They are interested in full dementia evaluation and potential medications if felt to be helpful.   Prior note:  Caroline Wallace is a 82 y.o.  female present for Follow-up to hypertension and chronic medical conditions. Patient presented for establishment visit last week, with obvious dementia. Full medical history/medications etc. was unable to be obtained. She presented with her goddaughter, with whom she has just recently moved in with, who also didn't have any information on the patient's medical conditions. Lab work was obtained on that visit with a normal CMP, CBC. A1c was 6.0. We have received at least a few visits faxed to [place] from her prior provider, full records have yet to be received. Patient was restarted on lisinopril 30 mg, and she has tolerated well. She is also taking an allergy/Claritin medication for increased allergies.   Prior note: Patient presents with her God daughter, which is with whom she now lives since the beginning of this week. She was living with a cousin in [place], [place]. She comes in with very limited history and confusion on medications. She has two medications with her, lisinopril and metformin, both bottles over 13 years old. After pts OV today I was able to obtain last OV from her prior PCP.  Last OV at prior PCP dated 09/30/2015 was for altered  mental  status/dementia new onset. Her BP at that time was normal. Her God daughter has been giving her the out of date lisinopril 30 mg the last few days, and her BP is above goal today. She performed 21/30 MMSE, referred to neurology and had a head CT with chronic changes of atrophy and  Microvascular ischemia, no acute process. She was treated for presumed UTI with Cipro. Labs cbc, cmp, tsh, UA and C/S were WNL. APS was called for the pt, uncertain if any follow  through. This is likely why she is relocating to [place], and now living with her god daughter.    Immunization History  Administered Date(s) Administered  . Influenza, High Dose Seasonal PF 12/08/2015     Past Medical History:  Diagnosis Date  . Allergy   . Altered mental status 2017  . CKD (chronic kidney disease), stage II    GFR 60s  . Dementia 2017  . Diabetes mellitus without complication ([place])   . Hypertension   . Osteoporosis    Allergies  Allergen Reactions  . Penicillins Hives  . Fruit & Vegetable Daily [Nutritional Supplements] Rash   History reviewed. No pertinent surgical history. Family History  Problem Relation Age of Onset  . Lung cancer Mother    Social History   Social History  . Marital status: Widowed    Spouse name: N/A  . Number of children: N/A  . Years of education: N/A   Occupational History  . Not on file.   Social History Main Topics  . Smoking status: Never Smoker  . Smokeless tobacco: Never Used  . Alcohol use No  . Drug use: No  . Sexual activity: No   Other Topics Concern  . Not on file   Social History Narrative   Widowed.    [employer].    Lives with her God daughter   HS diploma. Retired Primary school teacher.    No ETOH, Tobacco or drug use.    Wears her seatbelt. Smoke detector in the home.      Medication List       Accurate as of 12/21/15  3:53 PM. Always use your most recent med list.          chlorthalidone 25 MG tablet Commonly known as:  HYGROTON 1/2 tab po qd   lisinopril 30 MG tablet Commonly known as:  PRINIVIL,ZESTRIL Take 1 tablet (30 mg total) by mouth daily.   loratadine 10 MG tablet Commonly known as:  CLARITIN Take 1 tablet (10 mg total) by mouth daily.   MUCINEX 600 MG 12 hr tablet Generic drug:  guaiFENesin Take by mouth 2 (two) times daily.   traZODone 50 MG tablet Commonly known as:  DESYREL Take 1 tablet (50 mg total) by mouth at bedtime as needed for sleep.   VITAMIN D-1000 MAX ST  1000 units tablet Generic drug:  Cholecalciferol Take 1,000 Units by mouth daily.        Recent Results (from the past 2160 hour(s))  CBC w/Diff     Status: Abnormal   Collection Time: 11/23/15  2:57 PM  Result Value Ref Range   WBC 6.3 4.0 - 10.5 K/uL   RBC 4.24 3.87 - 5.11 Mil/uL   Hemoglobin 12.8 12.0 - 15.0 g/dL   HCT 37.7 36.0 - 46.0 %   MCV 88.7 78.0 - 100.0 fl   MCHC 33.9 30.0 - 36.0 g/dL   RDW 13.8 11.5 - 15.5 %   Platelets 211.0 150.0 - 400.0 K/uL  Neutrophils Relative % 63.7 43.0 - 77.0 %   Lymphocytes Relative 20.4 12.0 - 46.0 %   Monocytes Relative 8.9 3.0 - 12.0 %   Eosinophils Relative 6.5 (H) 0.0 - 5.0 %   Basophils Relative 0.5 0.0 - 3.0 %   Neutro Abs 4.0 1.4 - 7.7 K/uL   Lymphs Abs 1.3 0.7 - 4.0 K/uL   Monocytes Absolute 0.6 0.1 - 1.0 K/uL   Eosinophils Absolute 0.4 0.0 - 0.7 K/uL   Basophils Absolute 0.0 0.0 - 0.1 K/uL  HgB A1c     Status: None   Collection Time: 11/23/15  2:57 PM  Result Value Ref Range   Hgb A1c MFr Bld 6.0 4.6 - 6.5 %    Comment: Glycemic Control Guidelines for People with Diabetes:Non Diabetic:  <6%Goal of Therapy: <7%Additional Action Suggested:  >8%   Comp Met (CMET)     Status: None   Collection Time: 11/23/15  2:57 PM  Result Value Ref Range   Sodium 141 135 - 145 mEq/L   Potassium 4.0 3.5 - 5.1 mEq/L   Chloride 103 96 - 112 mEq/L   CO2 32 19 - 32 mEq/L   Glucose, Bld 93 70 - 99 mg/dL   BUN 16 6 - 23 mg/dL   Creatinine, Ser 1.00 0.40 - 1.20 mg/dL   Total Bilirubin 0.4 0.2 - 1.2 mg/dL   Alkaline Phosphatase 50 39 - 117 U/L   AST 19 0 - 37 U/L   ALT 9 0 - 35 U/L   Total Protein 7.0 6.0 - 8.3 g/dL   Albumin 3.9 3.5 - 5.2 g/dL   Calcium 8.9 8.4 - 10.5 mg/dL   GFR 68.00 >60.00 mL/min  Vitamin D (25 hydroxy)     Status: None   Collection Time: 12/03/15  3:21 PM  Result Value Ref Range   Vit D, 25-Hydroxy 30 30 - 100 ng/mL    Comment: Vitamin D Status           25-OH Vitamin D        Deficiency                <20 ng/mL         Insufficiency         20 - 29 ng/mL        Optimal             > or = 30 ng/mL   For 25-OH Vitamin D testing on patients on D2-supplementation and patients for whom quantitation of D2 and D3 fractions is required, the QuestAssureD 25-OH VIT D, (D2,D3), LC/MS/MS is recommended: order code [id number] (patients > 2 yrs).   LDL cholesterol, direct     Status: Abnormal   Collection Time: 12/03/15  3:21 PM  Result Value Ref Range   Direct LDL 138 (H) <130 mg/dL    Comment:   Desirable range <100 mg/dL for patients with CHD or diabetes and <70 mg/dL for diabetic patients with known heart disease.     TSH     Status: None   Collection Time: 12/08/15  3:19 PM  Result Value Ref Range   TSH 1.81 0.35 - 4.50 uIU/mL  T3     Status: None   Collection Time: 12/08/15  3:19 PM  Result Value Ref Range   T3, Total 98.0 76 - 181 ng/dL  T4, free     Status: None   Collection Time: 12/08/15  3:19 PM  Result Value Ref Range   Free  T4 0.83 0.60 - 1.60 ng/dL    Comment: Specimens from patients who are undergoing biotin therapy and /or ingesting biotin supplements may contain high levels of biotin.  The higher biotin concentration in these specimens interferes with this Free T4 assay.  Specimens that contain high levels  of biotin may cause false high results for this Free T4 assay.  Please interpret results in light of the total clinical presentation of the patient.    Basic metabolic panel     Status: Abnormal   Collection Time: 12/08/15  3:19 PM  Result Value Ref Range   Sodium 138 135 - 145 mEq/L   Potassium 4.1 3.5 - 5.1 mEq/L   Chloride 101 96 - 112 mEq/L   CO2 30 19 - 32 mEq/L   Glucose, Bld 89 70 - 99 mg/dL   BUN 30 (H) 6 - 23 mg/dL   Creatinine, Ser 1.23 (H) 0.40 - 1.20 mg/dL   Calcium 9.2 8.4 - 10.5 mg/dL   GFR 53.54 (L) >60.00 mL/min    Patient was never admitted.   ROS: 14 pt review of systems performed and negative (unless mentioned in an HPI)  Objective: BP 137/67 (BP  Location: Right Arm, Patient Position: Sitting, Cuff Size: Normal)   Pulse 75   Temp 98 F (36.7 C)   Resp 20   Wt 140 lb (63.5 kg)   SpO2 96%   BMI 24.41 kg/m  Gen: Afebrile. No acute distress. Nontoxic in appearance, well-developed, well-nourished, very pleasant AAF. Moderate-severe dementia. Very well groomed HENT: AT. [place]. MMM Eyes:Pupils Equal Round Reactive to light, Extraocular movements intact,  Conjunctiva without redness, discharge or icterus. Neck/lymp/endocrine: Supple,no lymphadenopathy CV: RRR, trace edema Chest: very mild rhonchi, no wheeze or crackles.  Abd: Soft. NTND. BS present Skin:  Warm and well-perfused. Skin intact. Neuro/Msk: short stride, forward head carriagel gait. PERLA. EOMi. Alert. Oriented to person.  Psych: Smiles appropriately. Appears happy. At least Moderate dementia, no behavior disturbances.    Assessment/plan: Caroline Wallace is a 82 y.o. female present for follow-up on establishment visit/hypertension and chronic medical conditions. Hypertension/hyperlipidemia:  - Stable. Continue  lisinopril 30 mg daily  - Home blood pressure readings are normal to low.  - Mild edema/venous stasis, compression stockings prescribed for patient.  - low salt. Keep active.  - f/u 3 months as long as home BP readings are within normal parameters.  Vitamin D deficiency/osteoporosis: - continue 1000 u vit d daily.    Diabetes (currently diet controlled):  - Continue to hold metformin for now, will repeat A1c 02/22/16 if elevated can consider medication.  - Diabetic foot exam: 12/03/2015, pt also referred to podiatry and has had her first appt last week.   - flu shot completed 12/08/2015 - PNA series: waiting on final records, may need given.  - eye exam: opthalmology referral placed.  - microalbumin: pt on Acei  Chronic cough:  - continue Claritin and mucinex. Lungs have mild rhonchi, will CXR considering blood tinged sputum last 3 days, consider bronchitis  treatment if needed. Pt God daughter concerned over mold exposure in her prior home that was condemned at the beginning of the summer.  - consider GERD and lisinopril as potential causes as well.   Dementia:  - by prior PCP notes this has been a concern at the beginning of the summer.  - MMSE 21/30 at that time. 09/23/2015 CT head: chronic changes of atrophy and microvascular ischemia. Neurology referral made by that PCP, uncertain if completed  or if so, the results are not mentioned.  - neuropsych referral. Discussed with pt/family they will  recommend medications if they feel it will be helpful for her. Sometimes medications are not going to beneficial enough to risk the side effects they potentially can cause after dementia has reached a certain point.  - Home health safety evaluation placed, family agreeable. Pt is wandering at night.  - pt is possibly being admitted to [facility] program after the beginning of the year. I think she would do rather well in that setting.    > 25 minutes spent with patient, >50% of time spent face to face counseling patient and coordinating care.   Electronically signed by: Howard Pouch, DO [place]

## 2015-12-22 ENCOUNTER — Telehealth: Payer: Self-pay | Admitting: Family Medicine

## 2015-12-22 DIAGNOSIS — J209 Acute bronchitis, unspecified: Secondary | ICD-10-CM

## 2015-12-22 DIAGNOSIS — R9389 Abnormal findings on diagnostic imaging of other specified body structures: Secondary | ICD-10-CM | POA: Insufficient documentation

## 2015-12-22 MED ORDER — DOXYCYCLINE HYCLATE 100 MG PO TABS: 100.0000 mg | ORAL_TABLET | Freq: Two times a day (BID) | ORAL | 0 refills | Status: DC

## 2015-12-22 MED FILL — DOXYCYCLINE HYCLATE 100 MG: 100 | 7 days supply | Qty: 14 | Fill #0

## 2015-12-22 NOTE — Telephone Encounter (Signed)
Olegario MessierKathy called and discussed xray results. Will treat 7 day course for bronchitis.  Placed order for repeat xray of Abdomen secondary to potential abnormal find vs artifact on cxr. She will take her asap to get completed.

## 2015-12-24 ENCOUNTER — Telehealth: Payer: Self-pay | Admitting: Family Medicine

## 2015-12-24 NOTE — Telephone Encounter (Signed)
Caroline Wallace with Caroline Wallace calling to confirm they received referral and she went to see patient today for initial evaluation.  Daughter is really supportive and have taken safety measures in the home for patient.  Please feel free to call Caroline Wallace back at [phone] with any questions or concerns.

## 2015-12-24 NOTE — Telephone Encounter (Signed)
Dr Kuneff aware. 

## 2015-12-27 ENCOUNTER — Telehealth: Payer: Self-pay | Admitting: Family Medicine

## 2015-12-27 MED FILL — ACETAMINOPHEN/COD #3 TABLET: 300-30 | 2 days supply | Qty: 12 | Fill #0

## 2015-12-27 MED FILL — CLINDAMYCIN HCL 300 MG CAPS: 300 | 7 days supply | Qty: 21 | Fill #0

## 2015-12-27 NOTE — Telephone Encounter (Signed)
L ear lobe has a split in it. It is not infected & looks like it has been there a while. It is healed up. Patient will not be able to wear ear rings but doesn't mind so an appointment was declined. Patient's god daughter just wanted it documented on the patient's chart.

## 2015-12-27 NOTE — Telephone Encounter (Signed)
Noted Dr Kuneff aware 

## 2015-12-28 ENCOUNTER — Telehealth: Payer: Self-pay | Admitting: Family Medicine

## 2015-12-28 DIAGNOSIS — F513 Sleepwalking [somnambulism]: Secondary | ICD-10-CM

## 2015-12-28 DIAGNOSIS — F0390 Unspecified dementia without behavioral disturbance: Secondary | ICD-10-CM

## 2015-12-28 NOTE — Telephone Encounter (Signed)
I have placed a neurology- psych referral for patients dementia evaluation. Her God daughter did have a specific doctor in mind, however I can not locate that physician in the system and I am uncertain if they are in network. You could reach out to them when placing referral to see if you can find out the provider they are requesting.  [place] specialist called them and reportedly stated they do not have a provider that provides neuropsych evals.

## 2015-12-30 ENCOUNTER — Telehealth: Payer: Self-pay | Admitting: Family Medicine

## 2015-12-30 NOTE — Telephone Encounter (Signed)
Patient's god daughter called. She noticed in MyChart that the patient needs some vaccines (she mentioned pneumonia). Please call her back.

## 2016-01-03 ENCOUNTER — Encounter: Payer: Self-pay | Admitting: *Deleted

## 2016-01-03 MED FILL — traZODone HCL 50 MG TABS: 50 | 30 days supply | Qty: 30 | Fill #1

## 2016-01-03 NOTE — Telephone Encounter (Signed)
I only found a single office visit note scanned in to the patient's chart. I didn't see any immunizations within the note.

## 2016-01-06 ENCOUNTER — Ambulatory Visit (HOSPITAL_BASED_OUTPATIENT_CLINIC_OR_DEPARTMENT_OTHER)
Admission: RE | Admit: 2016-01-06 | Discharge: 2016-01-06 | Disposition: A | Discharge status: Home / Self Care | Payer: Medicare PPO | Source: Ambulatory Visit | Attending: Family Medicine | Admitting: Family Medicine

## 2016-01-06 DIAGNOSIS — R938 Abnormal findings on diagnostic imaging of other specified body structures: Secondary | ICD-10-CM | POA: Insufficient documentation

## 2016-01-06 DIAGNOSIS — R9389 Abnormal findings on diagnostic imaging of other specified body structures: Secondary | ICD-10-CM

## 2016-01-07 ENCOUNTER — Telehealth: Payer: Self-pay | Admitting: Family Medicine

## 2016-01-07 NOTE — Telephone Encounter (Signed)
Please call pts god daughter: Her abd xray did not show the same defect from the prior. I suspect that was on her clothing.

## 2016-01-07 NOTE — Telephone Encounter (Signed)
Spoke with patient god daughter Caroline Wallace reviewed results. She states she is also having FMLA papers sent over for completion.

## 2016-01-10 ENCOUNTER — Telehealth: Payer: Self-pay | Admitting: Family Medicine

## 2016-01-10 NOTE — Telephone Encounter (Signed)
FMLA papers faxed. Patient God daughter picked up a copy.

## 2016-01-10 NOTE — Telephone Encounter (Signed)
FMLA papers for her God daughter-caretaker Olegario Messierkathy have been completed.

## 2016-01-20 ENCOUNTER — Telehealth: Payer: Self-pay | Admitting: Family Medicine

## 2016-01-20 ENCOUNTER — Ambulatory Visit (INDEPENDENT_AMBULATORY_CARE_PROVIDER_SITE_OTHER): Payer: Medicare PPO | Admitting: Family Medicine

## 2016-01-20 ENCOUNTER — Encounter: Payer: Self-pay | Admitting: Family Medicine

## 2016-01-20 VITALS — BP 131/77 | HR 76 | Temp 98.6°F | Resp 16 | Wt 133.8 lb

## 2016-01-20 DIAGNOSIS — K1120 Sialoadenitis, unspecified: Secondary | ICD-10-CM

## 2016-01-20 NOTE — Progress Notes (Signed)
OFFICE VISIT  01/20/2016   CC:  Chief Complaint  Patient presents with  . Sore Throat    knots on bilateral sides of neck   HPI:    Patient is a 82 y.o. female who presents for sore throat.  She is accompanied by her God daughter, who helps some with history.  Onset 4 d/a, feeling of soreness when swallowing and swelling under each jaw.  The soreness is where the swelling is, NOT in her throat. No fever.   No nasal congestion, no coughing.  No nausea.  Past Medical History:  Diagnosis Date  . Allergy   . Altered mental status 2017  . CKD (chronic kidney disease), stage II    GFR 60s  . Dementia 2017  . Diabetes mellitus without complication ([facility])   . Hypertension   . Osteoporosis   . Vitamin D deficiency     History reviewed. No pertinent surgical history.  Outpatient Medications Prior to Visit  Medication Sig Dispense Refill  . Cholecalciferol (VITAMIN D-1000 MAX ST) 1000 units tablet Take 1,000 Units by mouth daily.    Caroline Wallace. guaiFENesin (MUCINEX) 600 MG 12 hr tablet Take by mouth 2 (two) times daily.    Caroline Wallace. lisinopril (PRINIVIL,ZESTRIL) 30 MG tablet Take 1 tablet (30 mg total) by mouth daily. 90 tablet 1  . loratadine (CLARITIN) 10 MG tablet Take 1 tablet (10 mg total) by mouth daily. 90 tablet 3  . traZODone (DESYREL) 50 MG tablet Take 1 tablet (50 mg total) by mouth at bedtime as needed for sleep. 30 tablet 1  . doxycycline (VIBRA-TABS) 100 MG tablet Take 1 tablet (100 mg total) by mouth 2 (two) times daily. (Patient not taking: Reported on 01/20/2016) 14 tablet 0   No facility-administered medications prior to visit.     Allergies  Allergen Reactions  . Penicillins Hives  . Fruit & Vegetable Daily [Nutritional Supplements] Rash    ROS As per HPI  PE: Blood pressure 131/77, pulse 76, temperature 98.6 F (37 C), temperature source Temporal, resp. rate 16, weight 133 lb 12.8 oz (60.7 kg), SpO2 97 %. Gen: Alert, well appearing.  Patient is oriented to person, place,  time, and situation. ENT: Ears: EACs clear, normal epithelium.  TMs with good light reflex and landmarks bilaterally.  Eyes: no injection, icteris, swelling, or exudate.  EOMI, PERRLA. Nose: no drainage or turbinate edema/swelling.  No injection or focal lesion.  Mouth: lips without lesion/swelling.  Oral mucosa pink and moist.  Upper and lower dentures in place, buccal mucosa and gingiva are normal.  Oropharynx without erythema, exudate, or swelling.  I can palpate mildly enlarged submandibular glands under each mandible, +symmetric.  +Mildly tender to palpation on each side.  No parotid gland abnormality noted. No neck LAD.  No thyromegaly.  LABS:  none  IMPRESSION AND PLAN:  Sialadenitis (both submandibular glands). Sialolithiasis less likely given that this is affecting both sides equally. Discussed likely viral etiology, self limited nature of the illness. She can try eating sour candy to increase salivation. Signs/symptoms to call or return for were reviewed and pt expressed understanding.  If not feeling significant improvement in 1 wk she can call or return.  Would likely refer to ENT at that time. Offered ENT referral today but pt and God daughter declined.  FOLLOW UP: Return if symptoms worsen or fail to improve.  Signed:  S[place] BumpersPhil Hayle Parisi, MD           01/20/2016

## 2016-01-20 NOTE — Telephone Encounter (Signed)
Patient Name: Caroline KiefCLARA Mcmeans DOB: Jul 12, 1930 Initial Comment Caller states godmother was seen in the office today, has questions about salivary gland growth and increase in saliva production. Patient sees Dr. Milinda CaveMcGowen. Nurse Assessment Nurse: Yetta BarreJones, RN, Miranda Date/Time (Eastern Time): 01/20/2016 2:33:27 PM Confirm and document reason for call. If symptomatic, describe symptoms. You must click the next button to save text entered. ---Caller states the pt is not having more symptoms, she just needs clarification. Pt was diagnosed with Sialadenitis today and told to do things to increase production of saliva. She is concerned this would cause increasing symptoms not improvement. Has the patient traveled out of the country within the last 30 days? ---Not Applicable Does the patient have any new or worsening symptoms? ---No Please document clinical information provided and list any resource used. ---Told caller the purpose of increasing saliva is to flush the gland out to reduce inflammation. Information given per https:// [employer]id[employer].atmedlineplus.gov/ency/article/001041.htm Guidelines Guideline Title Affirmed Question Affirmed Notes Final Disposition User Clinical Call Yetta BarreJones, RN, [facility] [employer] [facility]

## 2016-01-20 NOTE — Progress Notes (Signed)
Pre visit review using our clinic review tool, if applicable. No additional management support is needed unless otherwise documented below in the visit note. 

## 2016-01-20 NOTE — Patient Instructions (Signed)
Suck on sour candy to increase your salivation. Call or return if not significantly improved in 1 week.

## 2016-01-28 MED FILL — traZODone HCL 50 MG TABS: 50 | 30 days supply | Qty: 30 | Fill #0

## 2016-01-31 ENCOUNTER — Other Ambulatory Visit: Payer: Self-pay | Admitting: *Deleted

## 2016-01-31 ENCOUNTER — Other Ambulatory Visit: Payer: Self-pay | Admitting: Family Medicine

## 2016-01-31 DIAGNOSIS — I10 Essential (primary) hypertension: Secondary | ICD-10-CM

## 2016-01-31 MED ORDER — TRAZODONE HCL 50 MG PO TABS: 50.0000 mg | ORAL_TABLET | Freq: Every evening | ORAL | 0 refills | Status: DC | PRN

## 2016-01-31 MED ORDER — LISINOPRIL 30 MG PO TABS: 30.0000 mg | ORAL_TABLET | Freq: Every day | ORAL | 1 refills | Status: DC

## 2016-01-31 NOTE — Telephone Encounter (Signed)
[facility] desk [facility] 01/28/16 even though we were closed.  Can patient please get RF [facility] tra[facility]adone [facility].  Please advise.

## 2016-03-08 ENCOUNTER — Telehealth: Payer: Self-pay | Admitting: Family Medicine

## 2016-03-08 DIAGNOSIS — E119 Type 2 diabetes mellitus without complications: Secondary | ICD-10-CM

## 2016-03-08 NOTE — Telephone Encounter (Signed)
[facility] should have faxed over a rx request for diabetic testing supplies.  Please send rx back to them asap so patient can monitor her blood sugar readings.  Also, please be aware patient did not qualify to enroll in the PACE program.

## 2016-03-08 NOTE — Telephone Encounter (Signed)
We can in diabetic supplies code is in her problem list. She probably does not need to check her sugars daily since she is NOT on medications and we were going to decide if that was necessary after we repeated her A1c again on her next appt.  However if she desires it now, we can attempt to call in supplies and she can check once a day fasting.

## 2016-03-08 NOTE — Telephone Encounter (Signed)
Spoke with Olegario MessierKathy she states she will discuss with Dr Claiborne BillingsKuneff at patient appt. She will call back to reschedule appt she states they cant come 03/22/16 due to another appt.

## 2016-03-22 ENCOUNTER — Ambulatory Visit: Payer: Medicare PPO | Admitting: Family Medicine

## 2016-03-28 ENCOUNTER — Encounter: Payer: Self-pay | Admitting: Family Medicine

## 2016-03-28 ENCOUNTER — Ambulatory Visit (INDEPENDENT_AMBULATORY_CARE_PROVIDER_SITE_OTHER): Payer: Medicare PPO | Admitting: Family Medicine

## 2016-03-28 VITALS — BP 134/79 | HR 87 | Temp 99.5°F | Resp 20 | Ht 64.0 in | Wt 132.0 lb

## 2016-03-28 DIAGNOSIS — F0390 Unspecified dementia without behavioral disturbance: Secondary | ICD-10-CM

## 2016-03-28 DIAGNOSIS — I10 Essential (primary) hypertension: Secondary | ICD-10-CM | POA: Diagnosis not present

## 2016-03-28 DIAGNOSIS — F513 Sleepwalking [somnambulism]: Secondary | ICD-10-CM

## 2016-03-28 DIAGNOSIS — E785 Hyperlipidemia, unspecified: Secondary | ICD-10-CM

## 2016-03-28 DIAGNOSIS — N183 Chronic kidney disease, stage 3 unspecified: Secondary | ICD-10-CM

## 2016-03-28 DIAGNOSIS — F05 Delirium due to known physiological condition: Secondary | ICD-10-CM | POA: Diagnosis not present

## 2016-03-28 DIAGNOSIS — R7309 Other abnormal glucose: Secondary | ICD-10-CM

## 2016-03-28 MED ORDER — LISINOPRIL 30 MG PO TABS: 30.0000 mg | ORAL_TABLET | Freq: Every day | ORAL | 1 refills | Status: DC

## 2016-03-28 MED ORDER — TRAZODONE HCL 50 MG PO TABS: 50.0000 mg | ORAL_TABLET | Freq: Every evening | ORAL | 1 refills | Status: DC | PRN

## 2016-03-28 NOTE — Patient Instructions (Addendum)
6 month follow up on chronic medical conditions And physical/medicare wellness yearly.    I was so nice to see you today!!!  Keep up the great diet and DANCE!!!!   Please help [place] help you:  It is a privilege to be able to take care of great patients such as yourself. We are honored you have chosen [facility] for your Primary Care home. Below you will find basic instructions that you may need to access in the future. Please help [place] help you by reading the instructions, which cover many of the frequent questions we experience.   Prescription refills and request:  -In order to allow more efficient response time, please call your pharmacy for all refills. They will forward the request electronically to [place]. This allows for the quickest possible response. Request left on a nurse line can take longer to refill, since these are checked as time allows between office patients and other phone calls.  - refill request can take up to 3-5 working days to complete.  - If request is sent electronically and request is appropiate, it is usually completed in 1-2 business days.  - all patients will need to be seen routinely for all chronic medical conditions requiring prescription medications (see follow-up below). If you are overdue for follow up on your condition, you will be asked to make an appointment and we will call in enough medication to cover you until your appointment (up to 30 days).  - all controlled substances will require a face to face visit to request/refill.  - if you desire your prescriptions to go through a new pharmacy, and have an active script at original pharmacy, you will need to call your pharmacy and have scripts transferred to new pharmacy. This is completed between the pharmacy locations and not by your provider.    Results: If any images or labs were ordered, it can take up to 1 week to get results depending on the test ordered and the lab/facility running and resulting the  test. - Normal or stable results, which do not need further discussion, will be released to your mychart immediately with attached note to you. A call will not be generated for normal results. Please make certain to sign up for mychart. If you have questions on how to activate your mychart you can call the front office.  - If your results need further discussion, our office will attempt to contact you via phone, and if unable to reach you after 2 attempts, we will release your abnormal result to your mychart with instructions.  - All results will be automatically released in mychart after 1 week.  - Your provider will provide you with explanation and instruction on all relevant material in your results. Please keep in mind, results and labs may appear confusing or abnormal to the untrained eye, but it does not mean they are actually abnormal for you personally. If you have any questions about your results that are not covered, or you desire more detailed explanation than what was provided, you should make an appointment with your provider to do so.   Our office handles many outgoing and incoming calls daily. If we have not contacted you within 1 week about your results, please check your mychart to see if there is a message first and if not, then contact our office.  In helping with this matter, you help decrease call volume, and therefore allow [place] to be able to respond to patients needs more efficiently.  Acute office visits (sick visit):  An acute visit is intended for a new problem and are scheduled in shorter time slots to allow schedule openings for patients with new problems. This is the appropriate visit to discuss a new problem. In order to provide you with excellent quality medical care with proper time for you to explain your problem, have an exam and receive treatment with instructions, these appointments should be limited to one new problem per visit. If you experience a new problem, in which  you desire to be addressed, please make an acute office visit, we save openings on the schedule to accommodate you. Please do not save your new problem for any other type of visit, let us take care of it properly and quickly for you.   Follow up visits:  Depending on your condition(s) your provider will need to see you routinely in order to provide you with quality care and prescribe medication(s). Most chronic conditions (Example: hypertension, Diabetes, depression/anxiety... etc), require visits a couple times a year. Your provider will instruct you on proper follow up for your personal medical conditions and history. Please make certain to make follow up appointments for your condition as instructed. Failing to do so could result in lapse in your medication treatment/refills. If you request a refill, and are overdue to be seen on a condition, we will always provide you with a 30 day script (once) to allow you time to schedule.    Medicare wellness (well visit): - we have a wonderful Nurse Maudie Mercury), that will meet with you and provide you will yearly medicare wellness visits. These visits should occur yearly (can not be scheduled less than 1 calendar year apart) and cover preventive health, immunizations, advance directives and screenings you are entitled to yearly through your medicare benefits. Do not miss out on your entitled benefits, this is when medicare will pay for these benefits to be ordered for you.  These are strongly encouraged by your provider and is the appropriate type of visit to make certain you are up to date with all preventive health benefits. If you have not had your medicare wellness exam in the last 12 months, please make certain to schedule one by calling the office and schedule your medicare wellness with Maudie Mercury as soon as possible.   Yearly physical (well visit):  - Adults are recommended to be seen yearly for physicals. Check with your insurance and date of your last physical, most  insurances require one calendar year between physicals. Physicals include all preventive health topics, screenings, medical exam and labs that are appropriate for gender/age and history. You may have fasting labs needed at this visit. This is a well visit (not a sick visit), acute topics should not be covered during this visit.  - Pediatric patients are seen more frequently when they are younger. Your provider will advise you on well child visit timing that is appropriate for your their age. - This is not a medicare wellness visit. Medicare wellness exams do not have an exam portion to the visit. Some medicare companies allow for a physical, some do not allow a yearly physical. If your medicare allows a yearly physical you can schedule the medicare wellness with our nurse Maudie Mercury and have your physical with your provider after, on the same day. Please check with insurance for your full benefits.   Late Policy/No Shows:  - all new patients should arrive 15-30 minutes earlier than appointment to allow [place] time  to  obtain all  personal demographics,  insurance information and for you to complete office paperwork. - All established patients should arrive 10-15 minutes earlier than appointment time to update all information and be checked in .  - In our best efforts to run on time, if you are late for your appointment you will be asked to either reschedule or if able, we will work you back into the schedule. There will be a wait time to work you back in the schedule,  depending on availability.  - If you are unable to make it to your appointment as scheduled, please call 24 hours ahead of time to allow us to fill the time slot with someone else who needs to be seen. If you do not cancel your appointment ahead of time, you may be charged a no show fee.

## 2016-03-28 NOTE — Progress Notes (Signed)
Patient ID: Caroline Wallace, female  DOB: 1930/05/28, 83 y.o.   MRN: [id number] Patient Care Team    Relationship Specialty Notifications Start End  Natalia Leatherwood, DO PCP - General Family Medicine  11/23/15   Vivi Barrack, DPM Consulting Physician Podiatry  12/21/15     Subjective:  Caroline Wallace is a 83 y.o.  female present for Follow-up to hypertension and chronic medical conditions.  Patient presents to office today for follow-up on her chronic medical conditions. She is accompanied with her God daughter with whom she lives. Much of the history is provided by cardiology, secondary to dementia condition and patient.  Sundowning/nocturnal wandering/dementia God daughter states that she has noticed increase in sundowning with changes in mood. Patient is more agitated in the afternoons and is like "Caroline Wallace and Caroline Wallace ". There had been some issues of nighttime wandering after moving in with her God daughter, and this has been resolved after the start of trazodone nightly. She has an appointment this week for a neuropsych eval.   Essential hypertension/hyperlipidemia/chronic kidney disease stage III Daughter reports her blood pressures have been stable. She is compliant with lisinopril 30 mg daily. Side effects mentioned. She denies chest pain, shortness of breath, lower extremity edema or dizziness. Statin was discontinued secondary to age and dementia onset. Last GFR 12/08/2015 53.54.  Elevated hemoglobin A1c/prior history diabetes Prior history of diabetes with metformin use. Establishing here patient has had nondiabetic range A1c without medications. She is exercising, and now living with her granddaughter which is watching. She has been seeing a podiatrist for her foot care.   Prior note: Patient presents with her God daughter, which is with whom she now lives since the beginning of this week. She was living with a cousin in [place], [place]. She comes in with very limited history  and confusion on medications. She has two medications with her, lisinopril and metformin, both bottles over 38 years old. After pts OV today I was able to obtain last OV from her prior PCP.  Last OV at prior PCP dated 09/30/2015 was for altered  mental status/dementis new onset. Her BP at that time was normal. Her God daughter has been giving her the out of date lisinopril 30 mg the last few days, and her BP is above goal today. She performed 21/30 MMSE, referred to neurology and had a head CT with chronic changes of atrophy and  Microvascular ischemia, no acute process. She was treated for presumed UTI with Cipro. Labs cbc, cmp, tsh, UA and C/S were WNL. APS was called for the pt, uncertain if any follow through. This is likely why she is relocating to [place], and now living with her god daughter.    Immunization History  Administered Date(s) Administered  . Influenza, High Dose Seasonal PF 12/08/2015    Past Medical History:  Diagnosis Date  . Allergy   . Altered mental status 2017  . CKD (chronic kidney disease), stage II    GFR 60s  . Dementia 2017  . Diabetes mellitus without complication (HCC)   . Hypertension   . Osteoporosis   . Vitamin D deficiency    Allergies  Allergen Reactions  . Penicillins Hives  . Fruit & Vegetable Daily [Nutritional Supplements] Rash   History reviewed. No pertinent surgical history. Family History  Problem Relation Age of Onset  . Lung cancer Mother    Social History   Social History  . Marital status: Widowed  Spouse name: N/A  . Number of children: N/A  . Years of education: N/A   Occupational History  . Not on file.   Social History Main Topics  . Smoking status: Never Smoker  . Smokeless tobacco: Never Used  . Alcohol use No  . Drug use: No  . Sexual activity: No   Other Topics Concern  . Not on file   Social History Narrative   Widowed.    [employer].    Lives with her God daughter   HS diploma. Retired Public librarian.     No ETOH, Tobacco or drug use.    Wears her seatbelt. Smoke detector in the home.    Allergies as of 03/28/2016      Reactions   Penicillins Hives   Fruit & Vegetable Daily [nutritional Supplements] Rash      Medication List       Accurate as of 03/28/16  3:16 PM. Always use your most recent med list.          lisinopril 30 MG tablet Commonly known as:  PRINIVIL,ZESTRIL Take 1 tablet (30 mg total) by mouth daily.   loratadine 10 MG tablet Commonly known as:  CLARITIN Take 1 tablet (10 mg total) by mouth daily.   MUCINEX 600 MG 12 hr tablet Generic drug:  guaiFENesin Take by mouth 2 (two) times daily.   traZODone 50 MG tablet Commonly known as:  DESYREL Take 1 tablet (50 mg total) by mouth at bedtime as needed for sleep.   VITAMIN D-1000 MAX ST 1000 units tablet Generic drug:  Cholecalciferol Take 1,000 Units by mouth daily.        No results found for this or any previous visit (from the past 2160 hour(s)).  Patient was never admitted.   ROS: 14 pt review of systems performed and negative (unless mentioned in an HPI)  Objective: BP 134/79 (BP Location: Left Arm, Patient Position: Sitting, Cuff Size: Normal)   Pulse 87   Temp 99.5 F (37.5 C)   Resp 20   Ht 5\' 4"  (1.626 m)   Wt 132 lb (59.9 kg)   SpO2 100%   BMI 22.66 kg/m  Gen: Afebrile. No acute distress. Very pleasant African-American female, moderate to severe dementia. HENT: AT. [place]. MMM.  Eyes:Pupils Equal Round Reactive to light, Extraocular movements intact,  Conjunctiva without redness, discharge or icterus. Neck/lymp/endocrine: Supple, no lymphadenopathy CV: RRR, no edema, +2/4 P posterior tibialis pulses Chest: CTAB, no wheeze or crackles Abd: Soft. NTND. BS present.  Skin: No rashes, purpura or petechiae. Warm well-perfused. Neuro: Short shuffle gait.  PERLA. EOMi. Alert. Oriented to person.  Psych: Very nicely dressed, takes great pride in her appearance. Moderate to severe dementia obvious.  Answers some questions appropriately. Very pleasant. Makes good eye contact when spoken directly to, otherwise remains removed from conversation.  Assessment/plan: Caroline Wallace is a 83 y.o. female present for follow-up on establishment visit/hypertension and chronic medical conditions. Sundowning/Nocturnal wandering/Dementia without behavioral disturbance, unspecified dementia type - Patient has neuropsych eval this week. The daughter does report increased agitation late afternoon. - Continue trazodone, seems to be effective to allow her to sleep last night.  Essential hypertension/CKD (chronic kidney disease) stage 3, GFR 30-59 ml/min/hyperlipidemia - Now stable. - Continue: lisinopril (PRINIVIL,ZESTRIL) 30 MG tablet; Take 1 tablet (30 mg total) by mouth daily.  Dispense: 90 tablet; Refill: 1 - Low-salt diet, try to increase exercise. She likes to dance and encouraged her to do more dancing. - BMP  every 6 months, last collected in 2017, GFR 53. - As had elevated lipids in the past, was on simvastatin. Discontinued secondary to age and dementia onset.  H/O Diabetes/eleavted a1c:  - Patient has not had multiple A1c is without medication and has a normal value. Discussed with patient and her God daughter, the history surrounding her "diabetes "came from her prior records and medications list. However she has been without medications and normal A1c since establishing here, therefore will remove diabetes as a problem for her. Would continue to monitor A1c yearly.  - Follow-up every 6 months on chronic medical conditions, and yearly for Medicare wellness/physical.  > 25 minutes spent with patient, >50% of time spent face to face counseling patient and coordinating care.   Electronically signed by: Felix Pacinienee Mida Cory, DO [facility]

## 2016-03-29 DIAGNOSIS — R7309 Other abnormal glucose: Secondary | ICD-10-CM | POA: Insufficient documentation

## 2016-04-13 ENCOUNTER — Telehealth: Payer: Self-pay | Admitting: Family Medicine

## 2016-04-13 DIAGNOSIS — F0281 Dementia in other diseases classified elsewhere with behavioral disturbance: Secondary | ICD-10-CM

## 2016-04-13 DIAGNOSIS — G309 Alzheimer's disease, unspecified: Principal | ICD-10-CM

## 2016-04-13 NOTE — Telephone Encounter (Signed)
Spoke with Olegario MessierKathy let her know we havent gotten a copy of her evaluation yet. She states they told her they would send them to us with recommendations for us to Rx medication to the patient. Explained to Olegario MessierKathy it would be up to Dr Claiborne BillingsKuneff after reading evaluation to decide if anything needed to be Rx'd. Explained  to her we would call her when we have reviewed results.

## 2016-04-13 NOTE — Telephone Encounter (Signed)
Patients POA (goddaughter) Olegario MessierKathy calling wondering if Psych Eval was received by Dr. Claiborne BillingsKuneff?  Wants to know if prescriptions are needed.

## 2016-04-14 ENCOUNTER — Telehealth: Payer: Self-pay | Admitting: Family Medicine

## 2016-04-14 MED ORDER — DONEPEZIL HCL 5 MG PO TABS: 5.0000 mg | ORAL_TABLET | Freq: Every day | ORAL | 1 refills | Status: DC

## 2016-04-14 NOTE — Telephone Encounter (Signed)
Olegario MessierKathy is calling to follow up on the fax you were sent yesterday. She is expressing urgency on a response because she is trying to get the patient into an assisted living facility. She is asking that you review and get back with her asap.

## 2016-04-14 NOTE — Telephone Encounter (Signed)
Please call pts POA Olegario Messier(kathy): - please thank her for sending the fax, especially since I did not receive any correspondence from the neuropsychologist Ms. Dock was evaluated by. I am not in the office on Thursday afternoons, so this is the first free moment I had to view. - Routinely this is be done by appt to discuss after I receive a report. I am sorry of the inconvenience of not being seen by a neuropsychiatrist (which normally prescribe medication after review of full report). I am ok with starting the Aricept by the recommendation of the psychologist now (by phone) since there is a formal diagnosis now form psychologist.  -I will need to follow up with them in 4 weeks to review the full recommendations and see how she is tolerating the medication and if doing well, will increase the medication at that time. If there is any additional concerns she should be encouraged to make an appt.

## 2016-04-14 NOTE — Telephone Encounter (Signed)
Medication is not at pharmacy although she was told it was.   Thanks.  -LL

## 2016-04-14 NOTE — Addendum Note (Signed)
Addended by: Felix PaciniKUNEFF, RENEE A on: 04/14/2016 10:26 AM   Modules accepted: Orders

## 2016-04-14 NOTE — Telephone Encounter (Addendum)
Spoke with Caroline MessierKathy patients POA reviewed information regarding patient seeing psychologist rather than psychiatrist. Caroline MessierKathy states she is a Engineer, civil (consulting)nurse and she knows the difference and she feels it was appropriate for her to see psychologist. She states she needs an appt sooner than 4 weeks she needs paperwork completed for her to place patient in a facility. Offered to schedule appt she states she will call back.

## 2016-04-14 NOTE — Telephone Encounter (Signed)
Patient;s daughter notified that prescription was sent to [facility]. She stated that was fine.

## 2016-04-19 ENCOUNTER — Telehealth: Payer: Self-pay | Admitting: Family Medicine

## 2016-04-19 NOTE — Telephone Encounter (Signed)
Caroline MessierKathy notified that TB can be placed on Fri. And Read on Mon. As long as patient is here before 3:00 pm on Monday to be read. Information was acknowledged and understood.

## 2016-04-19 NOTE — Telephone Encounter (Signed)
Caroline MessierKathy is calling regarding appt on Friday. Patient needs TB skin test for her transition to assisted living. She wants to make sure that the 72 hr window applies and that she can bring the patient Monday for test reading. Please let her know.

## 2016-04-21 ENCOUNTER — Ambulatory Visit (INDEPENDENT_AMBULATORY_CARE_PROVIDER_SITE_OTHER): Payer: Medicare PPO | Admitting: Family Medicine

## 2016-04-21 ENCOUNTER — Encounter: Payer: Self-pay | Admitting: Family Medicine

## 2016-04-21 VITALS — BP 126/71 | HR 96 | Temp 98.0°F | Resp 20 | Wt 132.0 lb

## 2016-04-21 DIAGNOSIS — F513 Sleepwalking [somnambulism]: Secondary | ICD-10-CM

## 2016-04-21 DIAGNOSIS — Z111 Encounter for screening for respiratory tuberculosis: Secondary | ICD-10-CM | POA: Diagnosis not present

## 2016-04-21 DIAGNOSIS — G308 Other Alzheimer's disease: Secondary | ICD-10-CM

## 2016-04-21 DIAGNOSIS — F05 Delirium due to known physiological condition: Secondary | ICD-10-CM

## 2016-04-21 DIAGNOSIS — G309 Alzheimer's disease, unspecified: Principal | ICD-10-CM

## 2016-04-21 DIAGNOSIS — N183 Chronic kidney disease, stage 3 unspecified: Secondary | ICD-10-CM

## 2016-04-21 DIAGNOSIS — E559 Vitamin D deficiency, unspecified: Secondary | ICD-10-CM

## 2016-04-21 DIAGNOSIS — R7309 Other abnormal glucose: Secondary | ICD-10-CM

## 2016-04-21 DIAGNOSIS — F0281 Dementia in other diseases classified elsewhere with behavioral disturbance: Secondary | ICD-10-CM

## 2016-04-21 DIAGNOSIS — I10 Essential (primary) hypertension: Secondary | ICD-10-CM

## 2016-04-21 DIAGNOSIS — M810 Age-related osteoporosis without current pathological fracture: Secondary | ICD-10-CM

## 2016-04-21 NOTE — Patient Instructions (Signed)
I will have out part filled out for you by the time you come back to get your PPD reading.   I will miss you. You will do great.   HAPPY BIRTHDAY!

## 2016-04-21 NOTE — Progress Notes (Signed)
Caroline Wallace , [birth year], 83 y.o., female MRN: [id number] Patient Care Team    Relationship Specialty Notifications Start End  Natalia Leatherwood, DO PCP - General Family Medicine  11/23/15   Vivi Barrack, DPM Consulting Physician Podiatry  12/21/15    Subjective: Pt presents for an OV With her goddaughter Caroline Wallace today to discuss her transitioning into [facility] assisted living facility. Caroline Wallace is a very pleasant 83 year old African-American female.   [name] although has a past medical history of Alzheimer's disease, hypertension, chronic kidney disease stage III, osteoporosis and elevated A1c. When she established with this provider approximately 5 months ago, many of her medications and diagnoses were unknown. Patient had been living with another family member and Adult Protective Services had stepped in secondary to concerns. Since Caroline Wallace has moved in with her Thea Wallace, and has done quite well. Caroline Wallace is able to perform the majority of her hygiene/personal care on her own. She is ambulatory, and even loves to dance. She would do well with group participation and interactive events. She has bladder and bowel continence. She is verbally communicative. She reportedly eats a normal diet, and is able to feed herself and cut up her own food. Patient does wear upper and lower dentures. She does not wear glasses or contacts.  Hypertension/CKD 3: After adjusting medications, patient has proven to be stable on lisinopril 30 mg daily. Her last chemistry panel was normal, with the exception of mildly decreased GFR of 53.54 and a creatinine on 1.23/BUN 30. CBC collected September 2017 was within normal limits.  Elevated A1c: Inpatient presented as a new patient, she had a diagnosis of diabetes type 2 without complication, and was prescribed metformin. It was a lead she had not been taking this medication. Therefore she was allowed trial off the medication with retesting improved  without medication she was only in the prediabetic range with an A1c of 6.0. Discussed monitoring every 6 months.  Alzheimer's dementia:  with nocturnal wandering and sundowning. He endorsed mood changes in the late afternoon, and nocturnal wandering. She has just recently seen by a neuro psychologist. She has been started on Aricept 5 mg daily at bedtime today. She has been prescribed trazodone 50 mg daily at bedtime for a few months, and is doing quite well on this medication. Onset believed to be approximately 3-4 years ago and insidious without precipitating  Factors. She was diagnosed with major neurocognitive disorder felt to be likely Alzheimer's disease with behavioral disturbances. Should be noted that Caroline Wallace's "behavioral disturbances " sundowning, with some mood swings. She is not violent in any nature. Her last Mini-Mental status exam in January 2018 she scored a 14/30. Patient will need monitored in 4-6 weeks from today in order to taper up on Aricept if new provider feels it's appropriate. Neuropsychologist had made other recommendations as potential medications. Full report available in Epic, and goddaughter has a copy of the report well if needed.  Immunization History  Administered Date(s) Administered  . Influenza, High Dose Seasonal PF 12/08/2015  . PPD Test 04/21/2016  Other immunizations unknown  Allergies  Allergen Reactions  . Penicillins Hives  . Fruit & Vegetable Daily [Nutritional Supplements] Rash   Social History  Substance Use Topics  . Smoking status: Never Smoker  . Smokeless tobacco: Never Used  . Alcohol use No   Past Medical History:  Diagnosis Date  . Allergy   . Altered mental status 2017  . CKD (chronic  kidney disease), stage II    GFR 60s  . Dementia 2017  . Diabetes mellitus without complication (HCC)   . Hypertension   . Osteoporosis   . Vitamin D deficiency    History reviewed. No pertinent surgical history. Family History  Problem  Relation Age of Onset  . Lung cancer Mother    Allergies as of 04/21/2016      Reactions   Penicillins Hives   Fruit & Vegetable Daily [nutritional Supplements] Rash      Medication List       Accurate as of 04/21/16  3:24 PM. Always use your most recent med list.          donepezil 5 MG tablet Commonly known as:  ARICEPT Take 1 tablet (5 mg total) by mouth at bedtime.   lisinopril 30 MG tablet Commonly known as:  PRINIVIL,ZESTRIL Take 1 tablet (30 mg total) by mouth daily.   loratadine 10 MG tablet Commonly known as:  CLARITIN Take 1 tablet (10 mg total) by mouth daily.   traZODone 50 MG tablet Commonly known as:  DESYREL Take 1 tablet (50 mg total) by mouth at bedtime as needed for sleep.   VITAMIN D-1000 MAX ST 1000 units tablet Generic drug:  Cholecalciferol Take 1,000 Units by mouth daily.      Results for Caroline Wallace, Devlynn J (MRN [id number]) as of 04/21/2016 17:31  Ref. Range 11/23/2015 14:57 12/03/2015 15:21 12/08/2015 15:19  BASIC METABOLIC PANEL Unknown   Rpt (A)  COMPREHENSIVE METABOLIC PANEL Unknown Rpt    Sodium Latest Ref Range: 135 - 145 mEq/L 141  138  Potassium Latest Ref Range: 3.5 - 5.1 mEq/L 4.0  4.1  Chloride Latest Ref Range: 96 - 112 mEq/L 103  101  CO2 Latest Ref Range: 19 - 32 mEq/L 32  30  Glucose Latest Ref Range: 70 - 99 mg/dL 93  89  BUN Latest Ref Range: 6 - 23 mg/dL 16  30 (H)  Creatinine Latest Ref Range: 0.40 - 1.20 mg/dL 4.091.00  8.111.23 (H)  Calcium Latest Ref Range: 8.4 - 10.5 mg/dL 8.9  9.2  Alkaline Phosphatase Latest Ref Range: 39 - 117 U/L 50    Albumin Latest Ref Range: 3.5 - 5.2 g/dL 3.9    AST Latest Ref Range: 0 - 37 U/L 19    ALT Latest Ref Range: 0 - 35 U/L 9    Total Protein Latest Ref Range: 6.0 - 8.3 g/dL 7.0    Total Bilirubin Latest Ref Range: 0.2 - 1.2 mg/dL 0.4    GFR Latest Ref Range: >60.00 mL/min 68.00  53.54 (L)  Direct LDL Latest Ref Range: <130 mg/dL  [id number] (H)   Vitamin D, [id number]-Hydroxy Latest Ref Range: 30 - 100 ng/mL   30   WBC Latest Ref Range: 4.0 - 10.5 K/uL 6.3    RBC Latest Ref Range: 3.87 - 5.11 Mil/uL 4.24    Hemoglobin Latest Ref Range: 12.0 - 15.0 g/dL 08.612.8    HCT Latest Ref Range: 36.0 - 46.0 % 37.7    MCV Latest Ref Range: 78.0 - 100.0 fl 88.7    MCHC Latest Ref Range: 30.0 - 36.0 g/dL 57.833.9    RDW Latest Ref Range: 11.5 - 15.5 % 13.8    Platelets Latest Ref Range: 150.0 - 400.0 K/uL 211.0    Neutrophils Latest Ref Range: 43.0 - 77.0 % 63.7    Lymphocytes Latest Ref Range: 12.0 - 46.0 % 20.4    Monocytes Relative Latest Ref  Range: 3.0 - 12.0 % 8.9    Eosinophil Latest Ref Range: 0.0 - 5.0 % 6.5 (H)    Basophil Latest Ref Range: 0.0 - 3.0 % 0.5    NEUT# Latest Ref Range: 1.4 - 7.7 K/uL 4.0    Lymphocyte # Latest Ref Range: 0.7 - 4.0 K/uL 1.3    Monocyte # Latest Ref Range: 0.1 - 1.0 K/uL 0.6    Eosinophils Absolute Latest Ref Range: 0.0 - 0.7 K/uL 0.4    Basophils Absolute Latest Ref Range: 0.0 - 0.1 K/uL 0.0    Hemoglobin A1C Latest Ref Range: 4.6 - 6.5 % 6.0    TSH Latest Ref Range: 0.35 - 4.50 uIU/mL   1.81  Triiodothyronine (T3) Latest Ref Range: 76 - 181 ng/dL   16.1  W9,UEAV(WUJWJX) Latest Ref Range: 0.60 - 1.60 ng/dL   9.14     ROS: Negative, with the exception of above mentioned in HPI   Objective:  BP 126/71 (BP Location: Left Arm, Patient Position: Sitting, Cuff Size: Normal)   Pulse 96   Temp 98 F (36.7 C)   Resp 20   Wt 132 lb (59.9 kg)   SpO2 96%   BMI 22.66 kg/m  Body mass index is 22.66 kg/m. Gen: Afebrile. No acute distress. Nontoxic in appearance, well developed, well nourished. Very pleasant African-American female. HENT: AT. [place]. Bilateral TM visualized and normal limits. MMM, no oral lesions. Bilateral nares normal limits. Throat without erythema or exudates. No cough. No hoarseness. Wears upper and lower dentures. Eyes:Pupils Equal Round Reactive to light, Extraocular movements intact,  Conjunctiva without redness, discharge or icterus. Neck/lymp/endocrine:  Supple, no lymphadenopathy CV: RRR, no edema Chest: CTAB, no wheeze or crackles. Good air movement, normal resp effort.  Abd: Soft. Flat. NTND. BS present.  MSK: Full range of motion, neurovascular intact. Skin: no rashes, purpura or petechiae.  Neuro:  Normal gait. PERLA. EOMi. Alert. Oriented x2 Psych: Severe neurocognitive disorder. Very pleasant. Cooperative. Happy.  Assessment/Plan: ALEESE KAMPS is a 83 y.o. female present for OV for transition into a assisted living facility. Patient is to be admitted on 05/08/2016 to [facility].  Description of major diagnosis and known history provided in above history of present illness. Initially patient should be seen every 30 days, while adjusting to new facilities, tapering on dementia medications.  Patient has had PPD placed today, and I'll return on Monday for reading by nurse FL2 completed for pick up on 04/24/2016. Ms. Zumbro has been a pleasure to take care of these last few months.  > 25 minutes spent with patient, >50% of time spent face to face counseling and/or coordinating care.   electronically signed by:  Felix Pacini, DO  [facility] [place]

## 2016-04-24 ENCOUNTER — Ambulatory Visit: Payer: Medicare PPO

## 2016-04-24 LAB — TB SKIN TEST
INDURATION: 0 mm
TB Skin Test: NEGATIVE

## 2016-05-05 ENCOUNTER — Telehealth: Payer: Self-pay | Admitting: *Deleted

## 2016-05-05 NOTE — Telephone Encounter (Signed)
Director from assisted living facility called she states she needs new FL2 form signed for patient she states the one she received states patient wanders at night she called family to clarify that patient does not try to exit the home at night they stated she wanders around the house due to sleep pattern but does not try to exit the house. Mia states she needs new FL2 done which clarifies this. Per Dr Claiborne BillingsKuneff POA would have to provide correct information since we have not witnessed her behavior . Mia states she will send corrected FL2 for signature.

## 2016-06-21 ENCOUNTER — Other Ambulatory Visit: Payer: Self-pay | Admitting: Family Medicine

## 2016-06-21 DIAGNOSIS — G309 Alzheimer's disease, unspecified: Principal | ICD-10-CM

## 2016-06-21 DIAGNOSIS — I10 Essential (primary) hypertension: Secondary | ICD-10-CM

## 2016-06-21 DIAGNOSIS — F0281 Dementia in other diseases classified elsewhere with behavioral disturbance: Secondary | ICD-10-CM

## 2016-06-22 NOTE — Telephone Encounter (Signed)
Received refill requests for patient medications. Spoke with patient POA patient is in an assisted living facility and her meds are ordered by them.

## 2016-09-12 DIAGNOSIS — I10 Essential (primary) hypertension: Secondary | ICD-10-CM | POA: Diagnosis not present

## 2016-09-12 DIAGNOSIS — E559 Vitamin D deficiency, unspecified: Secondary | ICD-10-CM | POA: Diagnosis not present

## 2016-09-12 DIAGNOSIS — J309 Allergic rhinitis, unspecified: Secondary | ICD-10-CM | POA: Diagnosis not present

## 2016-09-12 DIAGNOSIS — R609 Edema, unspecified: Secondary | ICD-10-CM | POA: Diagnosis not present

## 2016-09-12 DIAGNOSIS — N183 Chronic kidney disease, stage 3 (moderate): Secondary | ICD-10-CM | POA: Diagnosis not present

## 2016-09-12 DIAGNOSIS — J302 Other seasonal allergic rhinitis: Secondary | ICD-10-CM | POA: Diagnosis not present

## 2016-09-12 DIAGNOSIS — G309 Alzheimer's disease, unspecified: Secondary | ICD-10-CM | POA: Diagnosis not present

## 2016-09-12 DIAGNOSIS — R7303 Prediabetes: Secondary | ICD-10-CM | POA: Diagnosis not present

## 2016-09-19 DIAGNOSIS — R609 Edema, unspecified: Secondary | ICD-10-CM | POA: Diagnosis not present

## 2016-09-19 DIAGNOSIS — F5109 Other insomnia not due to a substance or known physiological condition: Secondary | ICD-10-CM | POA: Diagnosis not present

## 2016-09-19 DIAGNOSIS — R54 Age-related physical debility: Secondary | ICD-10-CM | POA: Diagnosis not present

## 2016-09-19 DIAGNOSIS — N183 Chronic kidney disease, stage 3 (moderate): Secondary | ICD-10-CM | POA: Diagnosis not present

## 2016-09-19 DIAGNOSIS — R7303 Prediabetes: Secondary | ICD-10-CM | POA: Diagnosis not present

## 2016-09-19 DIAGNOSIS — G309 Alzheimer's disease, unspecified: Secondary | ICD-10-CM | POA: Diagnosis not present

## 2016-09-19 DIAGNOSIS — I10 Essential (primary) hypertension: Secondary | ICD-10-CM | POA: Diagnosis not present

## 2016-10-10 DIAGNOSIS — E559 Vitamin D deficiency, unspecified: Secondary | ICD-10-CM | POA: Diagnosis not present

## 2016-10-10 DIAGNOSIS — J302 Other seasonal allergic rhinitis: Secondary | ICD-10-CM | POA: Diagnosis not present

## 2016-10-10 DIAGNOSIS — G309 Alzheimer's disease, unspecified: Secondary | ICD-10-CM | POA: Diagnosis not present

## 2016-10-10 DIAGNOSIS — I10 Essential (primary) hypertension: Secondary | ICD-10-CM | POA: Diagnosis not present

## 2016-10-10 DIAGNOSIS — R54 Age-related physical debility: Secondary | ICD-10-CM | POA: Diagnosis not present

## 2016-10-10 DIAGNOSIS — R7303 Prediabetes: Secondary | ICD-10-CM | POA: Diagnosis not present

## 2016-10-10 DIAGNOSIS — N183 Chronic kidney disease, stage 3 (moderate): Secondary | ICD-10-CM | POA: Diagnosis not present

## 2016-10-10 DIAGNOSIS — R609 Edema, unspecified: Secondary | ICD-10-CM | POA: Diagnosis not present

## 2016-11-02 ENCOUNTER — Emergency Department (HOSPITAL_COMMUNITY)
Admission: EM | Admit: 2016-11-02 | Discharge: 2016-11-02 | Disposition: A | Discharge status: Home / Self Care | Payer: Medicare PPO | Attending: Emergency Medicine | Admitting: Emergency Medicine

## 2016-11-02 DIAGNOSIS — Y939 Activity, unspecified: Secondary | ICD-10-CM | POA: Diagnosis not present

## 2016-11-02 DIAGNOSIS — E1122 Type 2 diabetes mellitus with diabetic chronic kidney disease: Secondary | ICD-10-CM | POA: Diagnosis not present

## 2016-11-02 DIAGNOSIS — M549 Dorsalgia, unspecified: Secondary | ICD-10-CM | POA: Diagnosis not present

## 2016-11-02 DIAGNOSIS — Z79899 Other long term (current) drug therapy: Secondary | ICD-10-CM | POA: Diagnosis not present

## 2016-11-02 DIAGNOSIS — Y929 Unspecified place or not applicable: Secondary | ICD-10-CM | POA: Diagnosis not present

## 2016-11-02 DIAGNOSIS — N183 Chronic kidney disease, stage 3 (moderate): Secondary | ICD-10-CM | POA: Diagnosis not present

## 2016-11-02 DIAGNOSIS — Y999 Unspecified external cause status: Secondary | ICD-10-CM | POA: Diagnosis not present

## 2016-11-02 DIAGNOSIS — R4182 Altered mental status, unspecified: Secondary | ICD-10-CM | POA: Diagnosis not present

## 2016-11-02 DIAGNOSIS — S161XXA Strain of muscle, fascia and tendon at neck level, initial encounter: Secondary | ICD-10-CM | POA: Diagnosis not present

## 2016-11-02 DIAGNOSIS — M62838 Other muscle spasm: Secondary | ICD-10-CM | POA: Diagnosis not present

## 2016-11-02 DIAGNOSIS — I129 Hypertensive chronic kidney disease with stage 1 through stage 4 chronic kidney disease, or unspecified chronic kidney disease: Secondary | ICD-10-CM | POA: Insufficient documentation

## 2016-11-02 DIAGNOSIS — G308 Other Alzheimer's disease: Secondary | ICD-10-CM | POA: Diagnosis not present

## 2016-11-02 DIAGNOSIS — X58XXXA Exposure to other specified factors, initial encounter: Secondary | ICD-10-CM | POA: Insufficient documentation

## 2016-11-02 DIAGNOSIS — S199XXA Unspecified injury of neck, initial encounter: Secondary | ICD-10-CM | POA: Diagnosis not present

## 2016-11-02 MED ORDER — CYCLOBENZAPRINE HCL 5 MG PO TABS: 5.0000 mg | ORAL_TABLET | Freq: Two times a day (BID) | ORAL | 0 refills | Status: AC | PRN

## 2016-11-02 MED ORDER — DIAZEPAM 5 MG PO TABS
2.5000 mg | ORAL_TABLET | Freq: Once | ORAL | Status: AC
  Administered 2016-11-02: 2.5 mg via ORAL
  Filled 2016-11-02: qty 1

## 2016-11-02 NOTE — ED Notes (Signed)
[facility]

## 2016-11-02 NOTE — ED Triage Notes (Signed)
Per PTAR report:  [facility] from [facility].  Staff called EMS stating her neck is more swollen today than when this staff member saw her on Sunday 4 days ago.  Pt has advanced alzheimers but will follow simple commands.  Was able to walk to stretcher w/EMS. No tenderness to the "puffiness" to the neck per EMS.  Pt has been hanging head downward which staff state isn't her norm.    EMS VS: 102/54,66,98%RA, [id number].

## 2016-11-02 NOTE — ED Notes (Addendum)
Goddaughter's Number: Olegario MessierKathy [phone]

## 2016-11-02 NOTE — ED Notes (Signed)
Bed: WA02 Expected date:  Expected time:  Means of arrival:  Comments: EMS-altered/neck swollen

## 2016-11-04 NOTE — ED Provider Notes (Signed)
[facility]-EMERGENCY DEPT Provider Note   CSN: [id number] Arrival date & time: 11/02/16  1056     History   Chief Complaint Chief Complaint  Patient presents with  . Neck Pain    HPI Caroline Wallace is a 83 y.o. female.  HPI   83yo female with history of Alzheimer's disease, hypertension, CKD, osteoporosis presents with concern for neck pain. Talked with provider at facility who noted she had neck pain upon waking on Sunday, thought it was a crick in her neck, was doing warm compresses and it seemed to help, however this morning her symptoms appeared to be worse. She was holding her head in downward position, didn't want to move it back to take pills.  Patient may not be reliable historian, but reports only neck pain that radiates upwards, worse with movements. Slow onset.  No chest pain, no dyspnea, no numbness or weakness. No falls or trauma. No other acute illnesses.  Past Medical History:  Diagnosis Date  . Allergy   . Altered mental status 2017  . CKD (chronic kidney disease), stage II    GFR 60s  . Dementia 2017  . Diabetes mellitus without complication (HCC)   . Hypertension   . Osteoporosis   . Vitamin D deficiency     Patient Active Problem List   Diagnosis Date Noted  . Elevated hemoglobin A1c 03/29/2016  . Sundowning 03/28/2016  . Nocturnal wandering 12/21/2015  . Vitamin D deficiency 12/03/2015  . CKD (chronic kidney disease) stage 3, GFR 30-59 ml/min   . Essential hypertension   . Osteoporosis   . Alzheimer's dementia with behavioral disturbance 03/07/2015    No past surgical history on file.  OB History    No data available       Home Medications    Prior to Admission medications   Medication Sig Start Date End Date Taking? Authorizing Provider  acetaminophen (TYLENOL) 500 MG tablet Take 500 mg by mouth 2 (two) times daily.   Yes [provider]  Cholecalciferol (VITAMIN D-1000 MAX ST) 1000 units tablet Take 1,000 Units by mouth daily.   Yes  [provider]  donepezil (ARICEPT) 5 MG tablet Take 1 tablet (5 mg total) by mouth at bedtime. 04/14/16  Yes Kuneff, Renee A, DO  furosemide (LASIX) 20 MG tablet Take 10 mg by mouth daily.   Yes [provider]  lisinopril (PRINIVIL,ZESTRIL) 30 MG tablet Take 1 tablet (30 mg total) by mouth daily. 03/28/16  Yes Kuneff, Renee A, DO  loratadine (CLARITIN) 10 MG tablet Take 1 tablet (10 mg total) by mouth daily. 11/25/15  Yes Kuneff, Renee A, DO  memantine (NAMENDA) 5 MG tablet Take 5 mg by mouth daily.   Yes [provider]  risperiDONE (RISPERDAL) 0.5 MG tablet Take 0.5 mg by mouth 3 (three) times daily.   Yes [provider]  traZODone (DESYREL) 100 MG tablet Take 100 mg by mouth at bedtime.   Yes [provider]  traZODone (DESYREL) 150 MG tablet Take 75 mg by mouth daily at 3 pm.   Yes [provider]  cyclobenzaprine (FLEXERIL) 5 MG tablet Take 1 tablet (5 mg total) by mouth 2 (two) times daily as needed for muscle spasms. 11/02/16   Alvira Monday, MD  traZODone (DESYREL) 50 MG tablet Take 1 tablet (50 mg total) by mouth at bedtime as needed for sleep. Patient not taking: Reported on 11/02/2016 03/28/16   Natalia Leatherwood, DO    Family History Family History  Problem Relation Age of Onset  . Lung cancer Mother     Social History Social History  Substance Use Topics  . Smoking status: Never Smoker  . Smokeless tobacco: Never Used  . Alcohol use No     Allergies   Apple and Penicillins   Review of Systems Review of Systems  Constitutional: Negative for fever.  HENT: Negative for sore throat.   Eyes: Negative for visual disturbance.  Respiratory: Negative for cough and shortness of breath.   Cardiovascular: Negative for chest pain.  Gastrointestinal: Negative for abdominal pain, nausea and vomiting.  Genitourinary: Negative for difficulty urinating.  Musculoskeletal: Positive for neck pain and neck stiffness. Negative for  back pain.  Skin: Negative for rash.  Neurological: Negative for syncope, weakness, numbness and headaches.     Physical Exam Updated Vital Signs BP (!) 144/73 (BP Location: Right Arm)   Pulse 79   Temp 97.9 F (36.6 C) (Oral)   Resp 16   SpO2 98%   Physical Exam  Constitutional: She appears well-developed and well-nourished. No distress.  HENT:  Head: Normocephalic and atraumatic.  Eyes: Conjunctivae and EOM are normal.  Neck: Muscular tenderness (spasm bilateral neck, worse on left, tenderness extending down left trapezius) present. No spinous process tenderness present.  Cardiovascular: Normal rate, regular rhythm, normal heart sounds and intact distal pulses.  Exam reveals no gallop and no friction rub.   No murmur heard. Pulmonary/Chest: Effort normal and breath sounds normal. No respiratory distress. She has no wheezes. She has no rales.  Abdominal: Soft. She exhibits no distension. There is no tenderness. There is no guarding.  Musculoskeletal: She exhibits no edema or tenderness.  Neurological: She is alert. She has normal strength. No sensory deficit. GCS eye subscore is 4. GCS verbal subscore is 5. GCS motor subscore is 6.  Skin: Skin is warm and dry. No rash noted. She is not diaphoretic. No erythema.  Nursing note and vitals reviewed.    ED Treatments / Results  Labs (all labs ordered are listed, but only abnormal results are displayed) Labs Reviewed - No data to display  EKG  EKG Interpretation None       Radiology No results found.  Procedures Procedures (including critical care time)  Medications Ordered in ED Medications  diazepam (VALIUM) tablet 2.5 mg (2.5 mg Oral Given 11/02/16 1248)     Initial Impression / Assessment and Plan / ED Course  I have reviewed the triage vital signs and the nursing notes.  Pertinent labs & imaging results that were available during my care of the patient were reviewed by me and considered in my medical decision  making (see chart for details).     83yo female with history of Alzheimer's disease, hypertension, CKD, osteoporosis presents with concern for neck pain. No hx of trauma, doubt fracture. Slow onset of symptoms, doubt SAH. Good bilateral strength and sensation.  Pain worse with movement and palpation, doubt ACS, PE. No sign of cellulitis or abscess on exam. No drooling, no fevers, doubt deep neck space infxn. Suspect MSK etiology of pain, muscle spasm. Given rx for flexeril. Patient discharged in stable condition with understanding of reasons to return.   Final Clinical Impressions(s) / ED Diagnoses   Final diagnoses:  Acute strain of neck muscle, initial encounter    New Prescriptions Discharge Medication List as of 11/02/2016 12:42 PM    START taking these medications   Details  cyclobenzaprine (FLEXERIL) 5 MG tablet Take 1 tablet (5 mg total)  by mouth 2 (two) times daily as needed for muscle spasms., Starting Thu 11/02/2016, Print         Alvira MondaySchlossman, Devlin Brink, MD 11/04/16 2214

## 2016-11-07 DIAGNOSIS — F5109 Other insomnia not due to a substance or known physiological condition: Secondary | ICD-10-CM | POA: Diagnosis not present

## 2016-11-07 DIAGNOSIS — N183 Chronic kidney disease, stage 3 (moderate): Secondary | ICD-10-CM | POA: Diagnosis not present

## 2016-11-07 DIAGNOSIS — G309 Alzheimer's disease, unspecified: Secondary | ICD-10-CM | POA: Diagnosis not present

## 2016-11-07 DIAGNOSIS — R54 Age-related physical debility: Secondary | ICD-10-CM | POA: Diagnosis not present

## 2016-11-07 DIAGNOSIS — I10 Essential (primary) hypertension: Secondary | ICD-10-CM | POA: Diagnosis not present

## 2016-11-07 DIAGNOSIS — R609 Edema, unspecified: Secondary | ICD-10-CM | POA: Diagnosis not present

## 2016-11-07 DIAGNOSIS — R7303 Prediabetes: Secondary | ICD-10-CM | POA: Diagnosis not present

## 2016-11-07 DIAGNOSIS — E559 Vitamin D deficiency, unspecified: Secondary | ICD-10-CM | POA: Diagnosis not present

## 2016-11-08 DIAGNOSIS — Z9181 History of falling: Secondary | ICD-10-CM | POA: Diagnosis not present

## 2016-11-08 DIAGNOSIS — E1121 Type 2 diabetes mellitus with diabetic nephropathy: Secondary | ICD-10-CM | POA: Diagnosis not present

## 2016-11-08 DIAGNOSIS — S161XXD Strain of muscle, fascia and tendon at neck level, subsequent encounter: Secondary | ICD-10-CM | POA: Diagnosis not present

## 2016-11-08 DIAGNOSIS — G309 Alzheimer's disease, unspecified: Secondary | ICD-10-CM | POA: Diagnosis not present

## 2016-11-08 DIAGNOSIS — M810 Age-related osteoporosis without current pathological fracture: Secondary | ICD-10-CM | POA: Diagnosis not present

## 2016-11-08 DIAGNOSIS — M6281 Muscle weakness (generalized): Secondary | ICD-10-CM | POA: Diagnosis not present

## 2016-11-08 DIAGNOSIS — L851 Acquired keratosis [keratoderma] palmaris et plantaris: Secondary | ICD-10-CM | POA: Diagnosis not present

## 2016-11-08 DIAGNOSIS — N183 Chronic kidney disease, stage 3 (moderate): Secondary | ICD-10-CM | POA: Diagnosis not present

## 2016-11-08 DIAGNOSIS — R2681 Unsteadiness on feet: Secondary | ICD-10-CM | POA: Diagnosis not present

## 2016-11-08 DIAGNOSIS — I129 Hypertensive chronic kidney disease with stage 1 through stage 4 chronic kidney disease, or unspecified chronic kidney disease: Secondary | ICD-10-CM | POA: Diagnosis not present

## 2016-11-08 DIAGNOSIS — M79673 Pain in unspecified foot: Secondary | ICD-10-CM | POA: Diagnosis not present

## 2016-11-08 DIAGNOSIS — F0280 Dementia in other diseases classified elsewhere without behavioral disturbance: Secondary | ICD-10-CM | POA: Diagnosis not present

## 2016-11-14 DIAGNOSIS — M6281 Muscle weakness (generalized): Secondary | ICD-10-CM | POA: Diagnosis not present

## 2016-11-14 DIAGNOSIS — I129 Hypertensive chronic kidney disease with stage 1 through stage 4 chronic kidney disease, or unspecified chronic kidney disease: Secondary | ICD-10-CM | POA: Diagnosis not present

## 2016-11-14 DIAGNOSIS — G309 Alzheimer's disease, unspecified: Secondary | ICD-10-CM | POA: Diagnosis not present

## 2016-11-14 DIAGNOSIS — F0280 Dementia in other diseases classified elsewhere without behavioral disturbance: Secondary | ICD-10-CM | POA: Diagnosis not present

## 2016-11-14 DIAGNOSIS — N183 Chronic kidney disease, stage 3 (moderate): Secondary | ICD-10-CM | POA: Diagnosis not present

## 2016-11-14 DIAGNOSIS — Z9181 History of falling: Secondary | ICD-10-CM | POA: Diagnosis not present

## 2016-11-14 DIAGNOSIS — S161XXD Strain of muscle, fascia and tendon at neck level, subsequent encounter: Secondary | ICD-10-CM | POA: Diagnosis not present

## 2016-11-14 DIAGNOSIS — M810 Age-related osteoporosis without current pathological fracture: Secondary | ICD-10-CM | POA: Diagnosis not present

## 2016-11-15 DIAGNOSIS — I129 Hypertensive chronic kidney disease with stage 1 through stage 4 chronic kidney disease, or unspecified chronic kidney disease: Secondary | ICD-10-CM | POA: Diagnosis not present

## 2016-11-15 DIAGNOSIS — F0280 Dementia in other diseases classified elsewhere without behavioral disturbance: Secondary | ICD-10-CM | POA: Diagnosis not present

## 2016-11-15 DIAGNOSIS — N183 Chronic kidney disease, stage 3 (moderate): Secondary | ICD-10-CM | POA: Diagnosis not present

## 2016-11-15 DIAGNOSIS — S161XXD Strain of muscle, fascia and tendon at neck level, subsequent encounter: Secondary | ICD-10-CM | POA: Diagnosis not present

## 2016-11-15 DIAGNOSIS — G309 Alzheimer's disease, unspecified: Secondary | ICD-10-CM | POA: Diagnosis not present

## 2016-11-15 DIAGNOSIS — Z9181 History of falling: Secondary | ICD-10-CM | POA: Diagnosis not present

## 2016-11-15 DIAGNOSIS — M810 Age-related osteoporosis without current pathological fracture: Secondary | ICD-10-CM | POA: Diagnosis not present

## 2016-11-15 DIAGNOSIS — M6281 Muscle weakness (generalized): Secondary | ICD-10-CM | POA: Diagnosis not present

## 2016-11-20 DIAGNOSIS — F0280 Dementia in other diseases classified elsewhere without behavioral disturbance: Secondary | ICD-10-CM | POA: Diagnosis not present

## 2016-11-20 DIAGNOSIS — I129 Hypertensive chronic kidney disease with stage 1 through stage 4 chronic kidney disease, or unspecified chronic kidney disease: Secondary | ICD-10-CM | POA: Diagnosis not present

## 2016-11-20 DIAGNOSIS — M810 Age-related osteoporosis without current pathological fracture: Secondary | ICD-10-CM | POA: Diagnosis not present

## 2016-11-20 DIAGNOSIS — S161XXD Strain of muscle, fascia and tendon at neck level, subsequent encounter: Secondary | ICD-10-CM | POA: Diagnosis not present

## 2016-11-20 DIAGNOSIS — M6281 Muscle weakness (generalized): Secondary | ICD-10-CM | POA: Diagnosis not present

## 2016-11-20 DIAGNOSIS — Z9181 History of falling: Secondary | ICD-10-CM | POA: Diagnosis not present

## 2016-11-20 DIAGNOSIS — N183 Chronic kidney disease, stage 3 (moderate): Secondary | ICD-10-CM | POA: Diagnosis not present

## 2016-11-20 DIAGNOSIS — G309 Alzheimer's disease, unspecified: Secondary | ICD-10-CM | POA: Diagnosis not present

## 2016-11-21 DIAGNOSIS — I129 Hypertensive chronic kidney disease with stage 1 through stage 4 chronic kidney disease, or unspecified chronic kidney disease: Secondary | ICD-10-CM | POA: Diagnosis not present

## 2016-11-21 DIAGNOSIS — G309 Alzheimer's disease, unspecified: Secondary | ICD-10-CM | POA: Diagnosis not present

## 2016-11-21 DIAGNOSIS — N183 Chronic kidney disease, stage 3 (moderate): Secondary | ICD-10-CM | POA: Diagnosis not present

## 2016-11-21 DIAGNOSIS — M810 Age-related osteoporosis without current pathological fracture: Secondary | ICD-10-CM | POA: Diagnosis not present

## 2016-11-21 DIAGNOSIS — F0280 Dementia in other diseases classified elsewhere without behavioral disturbance: Secondary | ICD-10-CM | POA: Diagnosis not present

## 2016-11-21 DIAGNOSIS — Z9181 History of falling: Secondary | ICD-10-CM | POA: Diagnosis not present

## 2016-11-21 DIAGNOSIS — S161XXD Strain of muscle, fascia and tendon at neck level, subsequent encounter: Secondary | ICD-10-CM | POA: Diagnosis not present

## 2016-11-21 DIAGNOSIS — M6281 Muscle weakness (generalized): Secondary | ICD-10-CM | POA: Diagnosis not present

## 2016-11-21 DIAGNOSIS — Z79899 Other long term (current) drug therapy: Secondary | ICD-10-CM | POA: Diagnosis not present

## 2016-11-22 DIAGNOSIS — I129 Hypertensive chronic kidney disease with stage 1 through stage 4 chronic kidney disease, or unspecified chronic kidney disease: Secondary | ICD-10-CM | POA: Diagnosis not present

## 2016-11-22 DIAGNOSIS — F0280 Dementia in other diseases classified elsewhere without behavioral disturbance: Secondary | ICD-10-CM | POA: Diagnosis not present

## 2016-11-22 DIAGNOSIS — G309 Alzheimer's disease, unspecified: Secondary | ICD-10-CM | POA: Diagnosis not present

## 2016-11-22 DIAGNOSIS — Z9181 History of falling: Secondary | ICD-10-CM | POA: Diagnosis not present

## 2016-11-22 DIAGNOSIS — S161XXD Strain of muscle, fascia and tendon at neck level, subsequent encounter: Secondary | ICD-10-CM | POA: Diagnosis not present

## 2016-11-22 DIAGNOSIS — N183 Chronic kidney disease, stage 3 (moderate): Secondary | ICD-10-CM | POA: Diagnosis not present

## 2016-11-22 DIAGNOSIS — M6281 Muscle weakness (generalized): Secondary | ICD-10-CM | POA: Diagnosis not present

## 2016-11-22 DIAGNOSIS — M810 Age-related osteoporosis without current pathological fracture: Secondary | ICD-10-CM | POA: Diagnosis not present

## 2016-11-23 DIAGNOSIS — I129 Hypertensive chronic kidney disease with stage 1 through stage 4 chronic kidney disease, or unspecified chronic kidney disease: Secondary | ICD-10-CM | POA: Diagnosis not present

## 2016-11-23 DIAGNOSIS — M6281 Muscle weakness (generalized): Secondary | ICD-10-CM | POA: Diagnosis not present

## 2016-11-23 DIAGNOSIS — S161XXD Strain of muscle, fascia and tendon at neck level, subsequent encounter: Secondary | ICD-10-CM | POA: Diagnosis not present

## 2016-11-23 DIAGNOSIS — G309 Alzheimer's disease, unspecified: Secondary | ICD-10-CM | POA: Diagnosis not present

## 2016-11-23 DIAGNOSIS — N183 Chronic kidney disease, stage 3 (moderate): Secondary | ICD-10-CM | POA: Diagnosis not present

## 2016-11-23 DIAGNOSIS — F0280 Dementia in other diseases classified elsewhere without behavioral disturbance: Secondary | ICD-10-CM | POA: Diagnosis not present

## 2016-11-23 DIAGNOSIS — Z9181 History of falling: Secondary | ICD-10-CM | POA: Diagnosis not present

## 2016-11-23 DIAGNOSIS — M810 Age-related osteoporosis without current pathological fracture: Secondary | ICD-10-CM | POA: Diagnosis not present

## 2016-11-24 DIAGNOSIS — G309 Alzheimer's disease, unspecified: Secondary | ICD-10-CM | POA: Diagnosis not present

## 2016-11-24 DIAGNOSIS — S161XXD Strain of muscle, fascia and tendon at neck level, subsequent encounter: Secondary | ICD-10-CM | POA: Diagnosis not present

## 2016-11-24 DIAGNOSIS — M810 Age-related osteoporosis without current pathological fracture: Secondary | ICD-10-CM | POA: Diagnosis not present

## 2016-11-24 DIAGNOSIS — F0280 Dementia in other diseases classified elsewhere without behavioral disturbance: Secondary | ICD-10-CM | POA: Diagnosis not present

## 2016-11-24 DIAGNOSIS — I129 Hypertensive chronic kidney disease with stage 1 through stage 4 chronic kidney disease, or unspecified chronic kidney disease: Secondary | ICD-10-CM | POA: Diagnosis not present

## 2016-11-24 DIAGNOSIS — N183 Chronic kidney disease, stage 3 (moderate): Secondary | ICD-10-CM | POA: Diagnosis not present

## 2016-11-24 DIAGNOSIS — M6281 Muscle weakness (generalized): Secondary | ICD-10-CM | POA: Diagnosis not present

## 2016-11-24 DIAGNOSIS — Z9181 History of falling: Secondary | ICD-10-CM | POA: Diagnosis not present

## 2016-11-27 DIAGNOSIS — N183 Chronic kidney disease, stage 3 (moderate): Secondary | ICD-10-CM | POA: Diagnosis not present

## 2016-11-27 DIAGNOSIS — M810 Age-related osteoporosis without current pathological fracture: Secondary | ICD-10-CM | POA: Diagnosis not present

## 2016-11-27 DIAGNOSIS — G309 Alzheimer's disease, unspecified: Secondary | ICD-10-CM | POA: Diagnosis not present

## 2016-11-27 DIAGNOSIS — M6281 Muscle weakness (generalized): Secondary | ICD-10-CM | POA: Diagnosis not present

## 2016-11-27 DIAGNOSIS — S161XXD Strain of muscle, fascia and tendon at neck level, subsequent encounter: Secondary | ICD-10-CM | POA: Diagnosis not present

## 2016-11-27 DIAGNOSIS — Z9181 History of falling: Secondary | ICD-10-CM | POA: Diagnosis not present

## 2016-11-27 DIAGNOSIS — I129 Hypertensive chronic kidney disease with stage 1 through stage 4 chronic kidney disease, or unspecified chronic kidney disease: Secondary | ICD-10-CM | POA: Diagnosis not present

## 2016-11-27 DIAGNOSIS — F0280 Dementia in other diseases classified elsewhere without behavioral disturbance: Secondary | ICD-10-CM | POA: Diagnosis not present

## 2016-11-28 DIAGNOSIS — S161XXA Strain of muscle, fascia and tendon at neck level, initial encounter: Secondary | ICD-10-CM | POA: Diagnosis not present

## 2016-11-29 DIAGNOSIS — M6281 Muscle weakness (generalized): Secondary | ICD-10-CM | POA: Diagnosis not present

## 2016-11-29 DIAGNOSIS — Z9181 History of falling: Secondary | ICD-10-CM | POA: Diagnosis not present

## 2016-11-29 DIAGNOSIS — S161XXD Strain of muscle, fascia and tendon at neck level, subsequent encounter: Secondary | ICD-10-CM | POA: Diagnosis not present

## 2016-11-29 DIAGNOSIS — G309 Alzheimer's disease, unspecified: Secondary | ICD-10-CM | POA: Diagnosis not present

## 2016-11-29 DIAGNOSIS — N183 Chronic kidney disease, stage 3 (moderate): Secondary | ICD-10-CM | POA: Diagnosis not present

## 2016-11-29 DIAGNOSIS — F0280 Dementia in other diseases classified elsewhere without behavioral disturbance: Secondary | ICD-10-CM | POA: Diagnosis not present

## 2016-11-29 DIAGNOSIS — M810 Age-related osteoporosis without current pathological fracture: Secondary | ICD-10-CM | POA: Diagnosis not present

## 2016-11-29 DIAGNOSIS — I129 Hypertensive chronic kidney disease with stage 1 through stage 4 chronic kidney disease, or unspecified chronic kidney disease: Secondary | ICD-10-CM | POA: Diagnosis not present

## 2016-11-30 DIAGNOSIS — Z9181 History of falling: Secondary | ICD-10-CM | POA: Diagnosis not present

## 2016-11-30 DIAGNOSIS — F0280 Dementia in other diseases classified elsewhere without behavioral disturbance: Secondary | ICD-10-CM | POA: Diagnosis not present

## 2016-11-30 DIAGNOSIS — M6281 Muscle weakness (generalized): Secondary | ICD-10-CM | POA: Diagnosis not present

## 2016-11-30 DIAGNOSIS — N183 Chronic kidney disease, stage 3 (moderate): Secondary | ICD-10-CM | POA: Diagnosis not present

## 2016-11-30 DIAGNOSIS — M810 Age-related osteoporosis without current pathological fracture: Secondary | ICD-10-CM | POA: Diagnosis not present

## 2016-11-30 DIAGNOSIS — S161XXD Strain of muscle, fascia and tendon at neck level, subsequent encounter: Secondary | ICD-10-CM | POA: Diagnosis not present

## 2016-11-30 DIAGNOSIS — I129 Hypertensive chronic kidney disease with stage 1 through stage 4 chronic kidney disease, or unspecified chronic kidney disease: Secondary | ICD-10-CM | POA: Diagnosis not present

## 2016-11-30 DIAGNOSIS — G309 Alzheimer's disease, unspecified: Secondary | ICD-10-CM | POA: Diagnosis not present

## 2016-12-01 DIAGNOSIS — Z9181 History of falling: Secondary | ICD-10-CM | POA: Diagnosis not present

## 2016-12-01 DIAGNOSIS — G309 Alzheimer's disease, unspecified: Secondary | ICD-10-CM | POA: Diagnosis not present

## 2016-12-01 DIAGNOSIS — M6281 Muscle weakness (generalized): Secondary | ICD-10-CM | POA: Diagnosis not present

## 2016-12-01 DIAGNOSIS — S161XXD Strain of muscle, fascia and tendon at neck level, subsequent encounter: Secondary | ICD-10-CM | POA: Diagnosis not present

## 2016-12-01 DIAGNOSIS — F0280 Dementia in other diseases classified elsewhere without behavioral disturbance: Secondary | ICD-10-CM | POA: Diagnosis not present

## 2016-12-01 DIAGNOSIS — I129 Hypertensive chronic kidney disease with stage 1 through stage 4 chronic kidney disease, or unspecified chronic kidney disease: Secondary | ICD-10-CM | POA: Diagnosis not present

## 2016-12-01 DIAGNOSIS — M810 Age-related osteoporosis without current pathological fracture: Secondary | ICD-10-CM | POA: Diagnosis not present

## 2016-12-01 DIAGNOSIS — N183 Chronic kidney disease, stage 3 (moderate): Secondary | ICD-10-CM | POA: Diagnosis not present

## 2016-12-04 DIAGNOSIS — F0280 Dementia in other diseases classified elsewhere without behavioral disturbance: Secondary | ICD-10-CM | POA: Diagnosis not present

## 2016-12-04 DIAGNOSIS — S161XXD Strain of muscle, fascia and tendon at neck level, subsequent encounter: Secondary | ICD-10-CM | POA: Diagnosis not present

## 2016-12-04 DIAGNOSIS — N183 Chronic kidney disease, stage 3 (moderate): Secondary | ICD-10-CM | POA: Diagnosis not present

## 2016-12-04 DIAGNOSIS — M810 Age-related osteoporosis without current pathological fracture: Secondary | ICD-10-CM | POA: Diagnosis not present

## 2016-12-04 DIAGNOSIS — G309 Alzheimer's disease, unspecified: Secondary | ICD-10-CM | POA: Diagnosis not present

## 2016-12-04 DIAGNOSIS — M6281 Muscle weakness (generalized): Secondary | ICD-10-CM | POA: Diagnosis not present

## 2016-12-04 DIAGNOSIS — I129 Hypertensive chronic kidney disease with stage 1 through stage 4 chronic kidney disease, or unspecified chronic kidney disease: Secondary | ICD-10-CM | POA: Diagnosis not present

## 2016-12-04 DIAGNOSIS — Z9181 History of falling: Secondary | ICD-10-CM | POA: Diagnosis not present

## 2016-12-05 DIAGNOSIS — R609 Edema, unspecified: Secondary | ICD-10-CM | POA: Diagnosis not present

## 2016-12-05 DIAGNOSIS — J309 Allergic rhinitis, unspecified: Secondary | ICD-10-CM | POA: Diagnosis not present

## 2016-12-05 DIAGNOSIS — E559 Vitamin D deficiency, unspecified: Secondary | ICD-10-CM | POA: Diagnosis not present

## 2016-12-05 DIAGNOSIS — N183 Chronic kidney disease, stage 3 (moderate): Secondary | ICD-10-CM | POA: Diagnosis not present

## 2016-12-05 DIAGNOSIS — I10 Essential (primary) hypertension: Secondary | ICD-10-CM | POA: Diagnosis not present

## 2016-12-05 DIAGNOSIS — R54 Age-related physical debility: Secondary | ICD-10-CM | POA: Diagnosis not present

## 2016-12-05 DIAGNOSIS — R7303 Prediabetes: Secondary | ICD-10-CM | POA: Diagnosis not present

## 2016-12-05 DIAGNOSIS — M542 Cervicalgia: Secondary | ICD-10-CM | POA: Diagnosis not present

## 2016-12-06 DIAGNOSIS — M810 Age-related osteoporosis without current pathological fracture: Secondary | ICD-10-CM | POA: Diagnosis not present

## 2016-12-06 DIAGNOSIS — M6281 Muscle weakness (generalized): Secondary | ICD-10-CM | POA: Diagnosis not present

## 2016-12-06 DIAGNOSIS — I129 Hypertensive chronic kidney disease with stage 1 through stage 4 chronic kidney disease, or unspecified chronic kidney disease: Secondary | ICD-10-CM | POA: Diagnosis not present

## 2016-12-06 DIAGNOSIS — S161XXD Strain of muscle, fascia and tendon at neck level, subsequent encounter: Secondary | ICD-10-CM | POA: Diagnosis not present

## 2016-12-06 DIAGNOSIS — F0280 Dementia in other diseases classified elsewhere without behavioral disturbance: Secondary | ICD-10-CM | POA: Diagnosis not present

## 2016-12-06 DIAGNOSIS — G309 Alzheimer's disease, unspecified: Secondary | ICD-10-CM | POA: Diagnosis not present

## 2016-12-06 DIAGNOSIS — Z9181 History of falling: Secondary | ICD-10-CM | POA: Diagnosis not present

## 2016-12-06 DIAGNOSIS — N183 Chronic kidney disease, stage 3 (moderate): Secondary | ICD-10-CM | POA: Diagnosis not present

## 2016-12-07 DIAGNOSIS — S161XXD Strain of muscle, fascia and tendon at neck level, subsequent encounter: Secondary | ICD-10-CM | POA: Diagnosis not present

## 2016-12-07 DIAGNOSIS — G309 Alzheimer's disease, unspecified: Secondary | ICD-10-CM | POA: Diagnosis not present

## 2016-12-07 DIAGNOSIS — I129 Hypertensive chronic kidney disease with stage 1 through stage 4 chronic kidney disease, or unspecified chronic kidney disease: Secondary | ICD-10-CM | POA: Diagnosis not present

## 2016-12-07 DIAGNOSIS — F0280 Dementia in other diseases classified elsewhere without behavioral disturbance: Secondary | ICD-10-CM | POA: Diagnosis not present

## 2016-12-07 DIAGNOSIS — Z9181 History of falling: Secondary | ICD-10-CM | POA: Diagnosis not present

## 2016-12-07 DIAGNOSIS — M6281 Muscle weakness (generalized): Secondary | ICD-10-CM | POA: Diagnosis not present

## 2016-12-07 DIAGNOSIS — N183 Chronic kidney disease, stage 3 (moderate): Secondary | ICD-10-CM | POA: Diagnosis not present

## 2016-12-07 DIAGNOSIS — M810 Age-related osteoporosis without current pathological fracture: Secondary | ICD-10-CM | POA: Diagnosis not present

## 2016-12-11 DIAGNOSIS — G309 Alzheimer's disease, unspecified: Secondary | ICD-10-CM | POA: Diagnosis not present

## 2016-12-11 DIAGNOSIS — S161XXD Strain of muscle, fascia and tendon at neck level, subsequent encounter: Secondary | ICD-10-CM | POA: Diagnosis not present

## 2016-12-11 DIAGNOSIS — N183 Chronic kidney disease, stage 3 (moderate): Secondary | ICD-10-CM | POA: Diagnosis not present

## 2016-12-11 DIAGNOSIS — Z9181 History of falling: Secondary | ICD-10-CM | POA: Diagnosis not present

## 2016-12-11 DIAGNOSIS — F0280 Dementia in other diseases classified elsewhere without behavioral disturbance: Secondary | ICD-10-CM | POA: Diagnosis not present

## 2016-12-11 DIAGNOSIS — M810 Age-related osteoporosis without current pathological fracture: Secondary | ICD-10-CM | POA: Diagnosis not present

## 2016-12-11 DIAGNOSIS — I129 Hypertensive chronic kidney disease with stage 1 through stage 4 chronic kidney disease, or unspecified chronic kidney disease: Secondary | ICD-10-CM | POA: Diagnosis not present

## 2016-12-11 DIAGNOSIS — M6281 Muscle weakness (generalized): Secondary | ICD-10-CM | POA: Diagnosis not present

## 2016-12-12 DIAGNOSIS — S161XXD Strain of muscle, fascia and tendon at neck level, subsequent encounter: Secondary | ICD-10-CM | POA: Diagnosis not present

## 2016-12-12 DIAGNOSIS — I129 Hypertensive chronic kidney disease with stage 1 through stage 4 chronic kidney disease, or unspecified chronic kidney disease: Secondary | ICD-10-CM | POA: Diagnosis not present

## 2016-12-12 DIAGNOSIS — M810 Age-related osteoporosis without current pathological fracture: Secondary | ICD-10-CM | POA: Diagnosis not present

## 2016-12-12 DIAGNOSIS — Z9181 History of falling: Secondary | ICD-10-CM | POA: Diagnosis not present

## 2016-12-12 DIAGNOSIS — M6281 Muscle weakness (generalized): Secondary | ICD-10-CM | POA: Diagnosis not present

## 2016-12-12 DIAGNOSIS — N183 Chronic kidney disease, stage 3 (moderate): Secondary | ICD-10-CM | POA: Diagnosis not present

## 2016-12-12 DIAGNOSIS — G309 Alzheimer's disease, unspecified: Secondary | ICD-10-CM | POA: Diagnosis not present

## 2016-12-12 DIAGNOSIS — F0280 Dementia in other diseases classified elsewhere without behavioral disturbance: Secondary | ICD-10-CM | POA: Diagnosis not present

## 2016-12-13 DIAGNOSIS — Z9181 History of falling: Secondary | ICD-10-CM | POA: Diagnosis not present

## 2016-12-13 DIAGNOSIS — N183 Chronic kidney disease, stage 3 (moderate): Secondary | ICD-10-CM | POA: Diagnosis not present

## 2016-12-13 DIAGNOSIS — F0280 Dementia in other diseases classified elsewhere without behavioral disturbance: Secondary | ICD-10-CM | POA: Diagnosis not present

## 2016-12-13 DIAGNOSIS — M6281 Muscle weakness (generalized): Secondary | ICD-10-CM | POA: Diagnosis not present

## 2016-12-13 DIAGNOSIS — M810 Age-related osteoporosis without current pathological fracture: Secondary | ICD-10-CM | POA: Diagnosis not present

## 2016-12-13 DIAGNOSIS — G309 Alzheimer's disease, unspecified: Secondary | ICD-10-CM | POA: Diagnosis not present

## 2016-12-13 DIAGNOSIS — I129 Hypertensive chronic kidney disease with stage 1 through stage 4 chronic kidney disease, or unspecified chronic kidney disease: Secondary | ICD-10-CM | POA: Diagnosis not present

## 2016-12-13 DIAGNOSIS — S161XXD Strain of muscle, fascia and tendon at neck level, subsequent encounter: Secondary | ICD-10-CM | POA: Diagnosis not present

## 2016-12-14 DIAGNOSIS — I129 Hypertensive chronic kidney disease with stage 1 through stage 4 chronic kidney disease, or unspecified chronic kidney disease: Secondary | ICD-10-CM | POA: Diagnosis not present

## 2016-12-14 DIAGNOSIS — M810 Age-related osteoporosis without current pathological fracture: Secondary | ICD-10-CM | POA: Diagnosis not present

## 2016-12-14 DIAGNOSIS — S161XXD Strain of muscle, fascia and tendon at neck level, subsequent encounter: Secondary | ICD-10-CM | POA: Diagnosis not present

## 2016-12-14 DIAGNOSIS — G309 Alzheimer's disease, unspecified: Secondary | ICD-10-CM | POA: Diagnosis not present

## 2016-12-14 DIAGNOSIS — Z9181 History of falling: Secondary | ICD-10-CM | POA: Diagnosis not present

## 2016-12-14 DIAGNOSIS — F0280 Dementia in other diseases classified elsewhere without behavioral disturbance: Secondary | ICD-10-CM | POA: Diagnosis not present

## 2016-12-14 DIAGNOSIS — N183 Chronic kidney disease, stage 3 (moderate): Secondary | ICD-10-CM | POA: Diagnosis not present

## 2016-12-14 DIAGNOSIS — M6281 Muscle weakness (generalized): Secondary | ICD-10-CM | POA: Diagnosis not present

## 2016-12-18 DIAGNOSIS — I129 Hypertensive chronic kidney disease with stage 1 through stage 4 chronic kidney disease, or unspecified chronic kidney disease: Secondary | ICD-10-CM | POA: Diagnosis not present

## 2016-12-18 DIAGNOSIS — G309 Alzheimer's disease, unspecified: Secondary | ICD-10-CM | POA: Diagnosis not present

## 2016-12-18 DIAGNOSIS — Z9181 History of falling: Secondary | ICD-10-CM | POA: Diagnosis not present

## 2016-12-18 DIAGNOSIS — M810 Age-related osteoporosis without current pathological fracture: Secondary | ICD-10-CM | POA: Diagnosis not present

## 2016-12-18 DIAGNOSIS — F0280 Dementia in other diseases classified elsewhere without behavioral disturbance: Secondary | ICD-10-CM | POA: Diagnosis not present

## 2016-12-18 DIAGNOSIS — M6281 Muscle weakness (generalized): Secondary | ICD-10-CM | POA: Diagnosis not present

## 2016-12-18 DIAGNOSIS — S161XXD Strain of muscle, fascia and tendon at neck level, subsequent encounter: Secondary | ICD-10-CM | POA: Diagnosis not present

## 2016-12-18 DIAGNOSIS — N183 Chronic kidney disease, stage 3 (moderate): Secondary | ICD-10-CM | POA: Diagnosis not present

## 2016-12-20 DIAGNOSIS — Z9181 History of falling: Secondary | ICD-10-CM | POA: Diagnosis not present

## 2016-12-20 DIAGNOSIS — M6281 Muscle weakness (generalized): Secondary | ICD-10-CM | POA: Diagnosis not present

## 2016-12-20 DIAGNOSIS — N183 Chronic kidney disease, stage 3 (moderate): Secondary | ICD-10-CM | POA: Diagnosis not present

## 2016-12-20 DIAGNOSIS — M810 Age-related osteoporosis without current pathological fracture: Secondary | ICD-10-CM | POA: Diagnosis not present

## 2016-12-20 DIAGNOSIS — I129 Hypertensive chronic kidney disease with stage 1 through stage 4 chronic kidney disease, or unspecified chronic kidney disease: Secondary | ICD-10-CM | POA: Diagnosis not present

## 2016-12-20 DIAGNOSIS — G309 Alzheimer's disease, unspecified: Secondary | ICD-10-CM | POA: Diagnosis not present

## 2016-12-20 DIAGNOSIS — S161XXD Strain of muscle, fascia and tendon at neck level, subsequent encounter: Secondary | ICD-10-CM | POA: Diagnosis not present

## 2016-12-20 DIAGNOSIS — F0280 Dementia in other diseases classified elsewhere without behavioral disturbance: Secondary | ICD-10-CM | POA: Diagnosis not present

## 2016-12-25 DIAGNOSIS — F0280 Dementia in other diseases classified elsewhere without behavioral disturbance: Secondary | ICD-10-CM | POA: Diagnosis not present

## 2016-12-25 DIAGNOSIS — Z9181 History of falling: Secondary | ICD-10-CM | POA: Diagnosis not present

## 2016-12-25 DIAGNOSIS — S161XXD Strain of muscle, fascia and tendon at neck level, subsequent encounter: Secondary | ICD-10-CM | POA: Diagnosis not present

## 2016-12-25 DIAGNOSIS — G309 Alzheimer's disease, unspecified: Secondary | ICD-10-CM | POA: Diagnosis not present

## 2016-12-25 DIAGNOSIS — I129 Hypertensive chronic kidney disease with stage 1 through stage 4 chronic kidney disease, or unspecified chronic kidney disease: Secondary | ICD-10-CM | POA: Diagnosis not present

## 2016-12-25 DIAGNOSIS — M6281 Muscle weakness (generalized): Secondary | ICD-10-CM | POA: Diagnosis not present

## 2016-12-25 DIAGNOSIS — N183 Chronic kidney disease, stage 3 (moderate): Secondary | ICD-10-CM | POA: Diagnosis not present

## 2016-12-25 DIAGNOSIS — M810 Age-related osteoporosis without current pathological fracture: Secondary | ICD-10-CM | POA: Diagnosis not present

## 2016-12-26 DIAGNOSIS — N183 Chronic kidney disease, stage 3 (moderate): Secondary | ICD-10-CM | POA: Diagnosis not present

## 2016-12-26 DIAGNOSIS — M810 Age-related osteoporosis without current pathological fracture: Secondary | ICD-10-CM | POA: Diagnosis not present

## 2016-12-26 DIAGNOSIS — I129 Hypertensive chronic kidney disease with stage 1 through stage 4 chronic kidney disease, or unspecified chronic kidney disease: Secondary | ICD-10-CM | POA: Diagnosis not present

## 2016-12-26 DIAGNOSIS — F0280 Dementia in other diseases classified elsewhere without behavioral disturbance: Secondary | ICD-10-CM | POA: Diagnosis not present

## 2016-12-26 DIAGNOSIS — M6281 Muscle weakness (generalized): Secondary | ICD-10-CM | POA: Diagnosis not present

## 2016-12-26 DIAGNOSIS — G309 Alzheimer's disease, unspecified: Secondary | ICD-10-CM | POA: Diagnosis not present

## 2016-12-26 DIAGNOSIS — Z9181 History of falling: Secondary | ICD-10-CM | POA: Diagnosis not present

## 2016-12-26 DIAGNOSIS — S161XXD Strain of muscle, fascia and tendon at neck level, subsequent encounter: Secondary | ICD-10-CM | POA: Diagnosis not present

## 2016-12-27 DIAGNOSIS — N183 Chronic kidney disease, stage 3 (moderate): Secondary | ICD-10-CM | POA: Diagnosis not present

## 2016-12-27 DIAGNOSIS — I129 Hypertensive chronic kidney disease with stage 1 through stage 4 chronic kidney disease, or unspecified chronic kidney disease: Secondary | ICD-10-CM | POA: Diagnosis not present

## 2016-12-27 DIAGNOSIS — M810 Age-related osteoporosis without current pathological fracture: Secondary | ICD-10-CM | POA: Diagnosis not present

## 2016-12-27 DIAGNOSIS — F0280 Dementia in other diseases classified elsewhere without behavioral disturbance: Secondary | ICD-10-CM | POA: Diagnosis not present

## 2016-12-27 DIAGNOSIS — S161XXD Strain of muscle, fascia and tendon at neck level, subsequent encounter: Secondary | ICD-10-CM | POA: Diagnosis not present

## 2016-12-27 DIAGNOSIS — G309 Alzheimer's disease, unspecified: Secondary | ICD-10-CM | POA: Diagnosis not present

## 2016-12-27 DIAGNOSIS — Z9181 History of falling: Secondary | ICD-10-CM | POA: Diagnosis not present

## 2016-12-27 DIAGNOSIS — M6281 Muscle weakness (generalized): Secondary | ICD-10-CM | POA: Diagnosis not present

## 2016-12-29 DIAGNOSIS — N183 Chronic kidney disease, stage 3 (moderate): Secondary | ICD-10-CM | POA: Diagnosis not present

## 2016-12-29 DIAGNOSIS — S161XXD Strain of muscle, fascia and tendon at neck level, subsequent encounter: Secondary | ICD-10-CM | POA: Diagnosis not present

## 2016-12-29 DIAGNOSIS — I129 Hypertensive chronic kidney disease with stage 1 through stage 4 chronic kidney disease, or unspecified chronic kidney disease: Secondary | ICD-10-CM | POA: Diagnosis not present

## 2016-12-29 DIAGNOSIS — M810 Age-related osteoporosis without current pathological fracture: Secondary | ICD-10-CM | POA: Diagnosis not present

## 2016-12-29 DIAGNOSIS — Z9181 History of falling: Secondary | ICD-10-CM | POA: Diagnosis not present

## 2016-12-29 DIAGNOSIS — G309 Alzheimer's disease, unspecified: Secondary | ICD-10-CM | POA: Diagnosis not present

## 2016-12-29 DIAGNOSIS — M6281 Muscle weakness (generalized): Secondary | ICD-10-CM | POA: Diagnosis not present

## 2016-12-29 DIAGNOSIS — F0280 Dementia in other diseases classified elsewhere without behavioral disturbance: Secondary | ICD-10-CM | POA: Diagnosis not present

## 2017-01-01 DIAGNOSIS — G309 Alzheimer's disease, unspecified: Secondary | ICD-10-CM | POA: Diagnosis not present

## 2017-01-01 DIAGNOSIS — Z9181 History of falling: Secondary | ICD-10-CM | POA: Diagnosis not present

## 2017-01-01 DIAGNOSIS — S161XXD Strain of muscle, fascia and tendon at neck level, subsequent encounter: Secondary | ICD-10-CM | POA: Diagnosis not present

## 2017-01-01 DIAGNOSIS — M6281 Muscle weakness (generalized): Secondary | ICD-10-CM | POA: Diagnosis not present

## 2017-01-01 DIAGNOSIS — M810 Age-related osteoporosis without current pathological fracture: Secondary | ICD-10-CM | POA: Diagnosis not present

## 2017-01-01 DIAGNOSIS — N183 Chronic kidney disease, stage 3 (moderate): Secondary | ICD-10-CM | POA: Diagnosis not present

## 2017-01-01 DIAGNOSIS — I129 Hypertensive chronic kidney disease with stage 1 through stage 4 chronic kidney disease, or unspecified chronic kidney disease: Secondary | ICD-10-CM | POA: Diagnosis not present

## 2017-01-01 DIAGNOSIS — F0280 Dementia in other diseases classified elsewhere without behavioral disturbance: Secondary | ICD-10-CM | POA: Diagnosis not present

## 2017-01-02 DIAGNOSIS — R54 Age-related physical debility: Secondary | ICD-10-CM | POA: Diagnosis not present

## 2017-01-02 DIAGNOSIS — F5109 Other insomnia not due to a substance or known physiological condition: Secondary | ICD-10-CM | POA: Diagnosis not present

## 2017-01-02 DIAGNOSIS — R609 Edema, unspecified: Secondary | ICD-10-CM | POA: Diagnosis not present

## 2017-01-02 DIAGNOSIS — I10 Essential (primary) hypertension: Secondary | ICD-10-CM | POA: Diagnosis not present

## 2017-01-02 DIAGNOSIS — G309 Alzheimer's disease, unspecified: Secondary | ICD-10-CM | POA: Diagnosis not present

## 2017-01-02 DIAGNOSIS — N183 Chronic kidney disease, stage 3 (moderate): Secondary | ICD-10-CM | POA: Diagnosis not present

## 2017-01-02 DIAGNOSIS — R7303 Prediabetes: Secondary | ICD-10-CM | POA: Diagnosis not present

## 2017-01-02 DIAGNOSIS — M542 Cervicalgia: Secondary | ICD-10-CM | POA: Diagnosis not present

## 2017-01-04 DIAGNOSIS — G309 Alzheimer's disease, unspecified: Secondary | ICD-10-CM | POA: Diagnosis not present

## 2017-01-04 DIAGNOSIS — I129 Hypertensive chronic kidney disease with stage 1 through stage 4 chronic kidney disease, or unspecified chronic kidney disease: Secondary | ICD-10-CM | POA: Diagnosis not present

## 2017-01-04 DIAGNOSIS — Z9181 History of falling: Secondary | ICD-10-CM | POA: Diagnosis not present

## 2017-01-04 DIAGNOSIS — M6281 Muscle weakness (generalized): Secondary | ICD-10-CM | POA: Diagnosis not present

## 2017-01-04 DIAGNOSIS — M810 Age-related osteoporosis without current pathological fracture: Secondary | ICD-10-CM | POA: Diagnosis not present

## 2017-01-04 DIAGNOSIS — F0280 Dementia in other diseases classified elsewhere without behavioral disturbance: Secondary | ICD-10-CM | POA: Diagnosis not present

## 2017-01-04 DIAGNOSIS — N183 Chronic kidney disease, stage 3 (moderate): Secondary | ICD-10-CM | POA: Diagnosis not present

## 2017-01-04 DIAGNOSIS — S161XXD Strain of muscle, fascia and tendon at neck level, subsequent encounter: Secondary | ICD-10-CM | POA: Diagnosis not present

## 2017-01-05 DIAGNOSIS — M6281 Muscle weakness (generalized): Secondary | ICD-10-CM | POA: Diagnosis not present

## 2017-01-05 DIAGNOSIS — Z9181 History of falling: Secondary | ICD-10-CM | POA: Diagnosis not present

## 2017-01-05 DIAGNOSIS — I129 Hypertensive chronic kidney disease with stage 1 through stage 4 chronic kidney disease, or unspecified chronic kidney disease: Secondary | ICD-10-CM | POA: Diagnosis not present

## 2017-01-05 DIAGNOSIS — G309 Alzheimer's disease, unspecified: Secondary | ICD-10-CM | POA: Diagnosis not present

## 2017-01-05 DIAGNOSIS — F0280 Dementia in other diseases classified elsewhere without behavioral disturbance: Secondary | ICD-10-CM | POA: Diagnosis not present

## 2017-01-05 DIAGNOSIS — M810 Age-related osteoporosis without current pathological fracture: Secondary | ICD-10-CM | POA: Diagnosis not present

## 2017-01-05 DIAGNOSIS — N183 Chronic kidney disease, stage 3 (moderate): Secondary | ICD-10-CM | POA: Diagnosis not present

## 2017-01-05 DIAGNOSIS — S161XXD Strain of muscle, fascia and tendon at neck level, subsequent encounter: Secondary | ICD-10-CM | POA: Diagnosis not present

## 2017-01-10 DIAGNOSIS — E1121 Type 2 diabetes mellitus with diabetic nephropathy: Secondary | ICD-10-CM | POA: Diagnosis not present

## 2017-01-10 DIAGNOSIS — L851 Acquired keratosis [keratoderma] palmaris et plantaris: Secondary | ICD-10-CM | POA: Diagnosis not present

## 2017-01-10 DIAGNOSIS — R2689 Other abnormalities of gait and mobility: Secondary | ICD-10-CM | POA: Diagnosis not present

## 2017-01-10 DIAGNOSIS — M79673 Pain in unspecified foot: Secondary | ICD-10-CM | POA: Diagnosis not present

## 2017-01-15 DIAGNOSIS — R3915 Urgency of urination: Secondary | ICD-10-CM | POA: Diagnosis not present

## 2017-01-16 DIAGNOSIS — N183 Chronic kidney disease, stage 3 (moderate): Secondary | ICD-10-CM | POA: Diagnosis not present

## 2017-01-16 DIAGNOSIS — E559 Vitamin D deficiency, unspecified: Secondary | ICD-10-CM | POA: Diagnosis not present

## 2017-01-16 DIAGNOSIS — R609 Edema, unspecified: Secondary | ICD-10-CM | POA: Diagnosis not present

## 2017-01-16 DIAGNOSIS — I10 Essential (primary) hypertension: Secondary | ICD-10-CM | POA: Diagnosis not present

## 2017-01-16 DIAGNOSIS — G309 Alzheimer's disease, unspecified: Secondary | ICD-10-CM | POA: Diagnosis not present

## 2017-01-16 DIAGNOSIS — R7303 Prediabetes: Secondary | ICD-10-CM | POA: Diagnosis not present

## 2017-01-16 DIAGNOSIS — M542 Cervicalgia: Secondary | ICD-10-CM | POA: Diagnosis not present

## 2017-01-16 DIAGNOSIS — R54 Age-related physical debility: Secondary | ICD-10-CM | POA: Diagnosis not present

## 2017-02-06 DIAGNOSIS — F5109 Other insomnia not due to a substance or known physiological condition: Secondary | ICD-10-CM | POA: Diagnosis not present

## 2017-02-06 DIAGNOSIS — I10 Essential (primary) hypertension: Secondary | ICD-10-CM | POA: Diagnosis not present

## 2017-02-06 DIAGNOSIS — N183 Chronic kidney disease, stage 3 (moderate): Secondary | ICD-10-CM | POA: Diagnosis not present

## 2017-02-06 DIAGNOSIS — R54 Age-related physical debility: Secondary | ICD-10-CM | POA: Diagnosis not present

## 2017-02-06 DIAGNOSIS — G309 Alzheimer's disease, unspecified: Secondary | ICD-10-CM | POA: Diagnosis not present

## 2017-03-06 DIAGNOSIS — N183 Chronic kidney disease, stage 3 (moderate): Secondary | ICD-10-CM | POA: Diagnosis not present

## 2017-03-06 DIAGNOSIS — R7303 Prediabetes: Secondary | ICD-10-CM | POA: Diagnosis not present

## 2017-03-06 DIAGNOSIS — F5109 Other insomnia not due to a substance or known physiological condition: Secondary | ICD-10-CM | POA: Diagnosis not present

## 2017-03-06 DIAGNOSIS — G309 Alzheimer's disease, unspecified: Secondary | ICD-10-CM | POA: Diagnosis not present

## 2017-03-06 DIAGNOSIS — R54 Age-related physical debility: Secondary | ICD-10-CM | POA: Diagnosis not present

## 2017-03-06 DIAGNOSIS — R609 Edema, unspecified: Secondary | ICD-10-CM | POA: Diagnosis not present

## 2017-03-06 DIAGNOSIS — I10 Essential (primary) hypertension: Secondary | ICD-10-CM | POA: Diagnosis not present

## 2017-03-21 DIAGNOSIS — L602 Onychogryphosis: Secondary | ICD-10-CM | POA: Diagnosis not present

## 2017-03-21 DIAGNOSIS — R2681 Unsteadiness on feet: Secondary | ICD-10-CM | POA: Diagnosis not present

## 2017-03-21 DIAGNOSIS — M79673 Pain in unspecified foot: Secondary | ICD-10-CM | POA: Diagnosis not present

## 2017-03-21 DIAGNOSIS — L851 Acquired keratosis [keratoderma] palmaris et plantaris: Secondary | ICD-10-CM | POA: Diagnosis not present

## 2017-03-21 DIAGNOSIS — E1121 Type 2 diabetes mellitus with diabetic nephropathy: Secondary | ICD-10-CM | POA: Diagnosis not present

## 2017-04-03 DIAGNOSIS — N183 Chronic kidney disease, stage 3 (moderate): Secondary | ICD-10-CM | POA: Diagnosis not present

## 2017-04-03 DIAGNOSIS — I10 Essential (primary) hypertension: Secondary | ICD-10-CM | POA: Diagnosis not present

## 2017-04-03 DIAGNOSIS — G309 Alzheimer's disease, unspecified: Secondary | ICD-10-CM | POA: Diagnosis not present

## 2017-04-03 DIAGNOSIS — R54 Age-related physical debility: Secondary | ICD-10-CM | POA: Diagnosis not present

## 2017-04-03 DIAGNOSIS — R609 Edema, unspecified: Secondary | ICD-10-CM | POA: Diagnosis not present

## 2017-04-03 DIAGNOSIS — F5109 Other insomnia not due to a substance or known physiological condition: Secondary | ICD-10-CM | POA: Diagnosis not present

## 2017-04-21 DIAGNOSIS — L89621 Pressure ulcer of left heel, stage 1: Secondary | ICD-10-CM | POA: Diagnosis not present

## 2017-04-21 DIAGNOSIS — G309 Alzheimer's disease, unspecified: Secondary | ICD-10-CM | POA: Diagnosis not present

## 2017-04-21 DIAGNOSIS — F0280 Dementia in other diseases classified elsewhere without behavioral disturbance: Secondary | ICD-10-CM | POA: Diagnosis not present

## 2017-04-21 DIAGNOSIS — I129 Hypertensive chronic kidney disease with stage 1 through stage 4 chronic kidney disease, or unspecified chronic kidney disease: Secondary | ICD-10-CM | POA: Diagnosis not present

## 2017-04-21 DIAGNOSIS — Z9181 History of falling: Secondary | ICD-10-CM | POA: Diagnosis not present

## 2017-04-21 DIAGNOSIS — N183 Chronic kidney disease, stage 3 (moderate): Secondary | ICD-10-CM | POA: Diagnosis not present

## 2017-04-21 DIAGNOSIS — I10 Essential (primary) hypertension: Secondary | ICD-10-CM | POA: Diagnosis not present

## 2017-04-21 DIAGNOSIS — L89153 Pressure ulcer of sacral region, stage 3: Secondary | ICD-10-CM | POA: Diagnosis not present

## 2017-04-21 DIAGNOSIS — M810 Age-related osteoporosis without current pathological fracture: Secondary | ICD-10-CM | POA: Diagnosis not present

## 2017-04-22 DIAGNOSIS — Z9181 History of falling: Secondary | ICD-10-CM | POA: Diagnosis not present

## 2017-04-22 DIAGNOSIS — I129 Hypertensive chronic kidney disease with stage 1 through stage 4 chronic kidney disease, or unspecified chronic kidney disease: Secondary | ICD-10-CM | POA: Diagnosis not present

## 2017-04-22 DIAGNOSIS — F0280 Dementia in other diseases classified elsewhere without behavioral disturbance: Secondary | ICD-10-CM | POA: Diagnosis not present

## 2017-04-22 DIAGNOSIS — L89621 Pressure ulcer of left heel, stage 1: Secondary | ICD-10-CM | POA: Diagnosis not present

## 2017-04-22 DIAGNOSIS — M810 Age-related osteoporosis without current pathological fracture: Secondary | ICD-10-CM | POA: Diagnosis not present

## 2017-04-22 DIAGNOSIS — N183 Chronic kidney disease, stage 3 (moderate): Secondary | ICD-10-CM | POA: Diagnosis not present

## 2017-04-22 DIAGNOSIS — G309 Alzheimer's disease, unspecified: Secondary | ICD-10-CM | POA: Diagnosis not present

## 2017-04-22 DIAGNOSIS — L89153 Pressure ulcer of sacral region, stage 3: Secondary | ICD-10-CM | POA: Diagnosis not present

## 2017-04-24 DIAGNOSIS — F5109 Other insomnia not due to a substance or known physiological condition: Secondary | ICD-10-CM | POA: Diagnosis not present

## 2017-04-24 DIAGNOSIS — R54 Age-related physical debility: Secondary | ICD-10-CM | POA: Diagnosis not present

## 2017-04-24 DIAGNOSIS — N183 Chronic kidney disease, stage 3 (moderate): Secondary | ICD-10-CM | POA: Diagnosis not present

## 2017-04-24 DIAGNOSIS — M542 Cervicalgia: Secondary | ICD-10-CM | POA: Diagnosis not present

## 2017-04-24 DIAGNOSIS — G309 Alzheimer's disease, unspecified: Secondary | ICD-10-CM | POA: Diagnosis not present

## 2017-04-24 DIAGNOSIS — I10 Essential (primary) hypertension: Secondary | ICD-10-CM | POA: Diagnosis not present

## 2017-04-24 DIAGNOSIS — R7303 Prediabetes: Secondary | ICD-10-CM | POA: Diagnosis not present

## 2017-04-24 DIAGNOSIS — R609 Edema, unspecified: Secondary | ICD-10-CM | POA: Diagnosis not present

## 2017-04-25 DIAGNOSIS — M810 Age-related osteoporosis without current pathological fracture: Secondary | ICD-10-CM | POA: Diagnosis not present

## 2017-04-25 DIAGNOSIS — I129 Hypertensive chronic kidney disease with stage 1 through stage 4 chronic kidney disease, or unspecified chronic kidney disease: Secondary | ICD-10-CM | POA: Diagnosis not present

## 2017-04-25 DIAGNOSIS — F0280 Dementia in other diseases classified elsewhere without behavioral disturbance: Secondary | ICD-10-CM | POA: Diagnosis not present

## 2017-04-25 DIAGNOSIS — L89153 Pressure ulcer of sacral region, stage 3: Secondary | ICD-10-CM | POA: Diagnosis not present

## 2017-04-25 DIAGNOSIS — G309 Alzheimer's disease, unspecified: Secondary | ICD-10-CM | POA: Diagnosis not present

## 2017-04-25 DIAGNOSIS — Z9181 History of falling: Secondary | ICD-10-CM | POA: Diagnosis not present

## 2017-04-25 DIAGNOSIS — L89621 Pressure ulcer of left heel, stage 1: Secondary | ICD-10-CM | POA: Diagnosis not present

## 2017-04-25 DIAGNOSIS — N183 Chronic kidney disease, stage 3 (moderate): Secondary | ICD-10-CM | POA: Diagnosis not present

## 2017-04-28 DIAGNOSIS — G309 Alzheimer's disease, unspecified: Secondary | ICD-10-CM | POA: Diagnosis not present

## 2017-04-28 DIAGNOSIS — N183 Chronic kidney disease, stage 3 (moderate): Secondary | ICD-10-CM | POA: Diagnosis not present

## 2017-04-28 DIAGNOSIS — Z9181 History of falling: Secondary | ICD-10-CM | POA: Diagnosis not present

## 2017-04-28 DIAGNOSIS — I129 Hypertensive chronic kidney disease with stage 1 through stage 4 chronic kidney disease, or unspecified chronic kidney disease: Secondary | ICD-10-CM | POA: Diagnosis not present

## 2017-04-28 DIAGNOSIS — L89621 Pressure ulcer of left heel, stage 1: Secondary | ICD-10-CM | POA: Diagnosis not present

## 2017-04-28 DIAGNOSIS — M810 Age-related osteoporosis without current pathological fracture: Secondary | ICD-10-CM | POA: Diagnosis not present

## 2017-04-28 DIAGNOSIS — F0280 Dementia in other diseases classified elsewhere without behavioral disturbance: Secondary | ICD-10-CM | POA: Diagnosis not present

## 2017-04-28 DIAGNOSIS — L89153 Pressure ulcer of sacral region, stage 3: Secondary | ICD-10-CM | POA: Diagnosis not present

## 2017-05-01 DIAGNOSIS — I129 Hypertensive chronic kidney disease with stage 1 through stage 4 chronic kidney disease, or unspecified chronic kidney disease: Secondary | ICD-10-CM | POA: Diagnosis not present

## 2017-05-01 DIAGNOSIS — L89153 Pressure ulcer of sacral region, stage 3: Secondary | ICD-10-CM | POA: Diagnosis not present

## 2017-05-01 DIAGNOSIS — L89899 Pressure ulcer of other site, unspecified stage: Secondary | ICD-10-CM | POA: Diagnosis not present

## 2017-05-01 DIAGNOSIS — L89621 Pressure ulcer of left heel, stage 1: Secondary | ICD-10-CM | POA: Diagnosis not present

## 2017-05-01 DIAGNOSIS — N183 Chronic kidney disease, stage 3 (moderate): Secondary | ICD-10-CM | POA: Diagnosis not present

## 2017-05-01 DIAGNOSIS — M810 Age-related osteoporosis without current pathological fracture: Secondary | ICD-10-CM | POA: Diagnosis not present

## 2017-05-01 DIAGNOSIS — Z9181 History of falling: Secondary | ICD-10-CM | POA: Diagnosis not present

## 2017-05-01 DIAGNOSIS — G309 Alzheimer's disease, unspecified: Secondary | ICD-10-CM | POA: Diagnosis not present

## 2017-05-01 DIAGNOSIS — S161XXA Strain of muscle, fascia and tendon at neck level, initial encounter: Secondary | ICD-10-CM | POA: Diagnosis not present

## 2017-05-01 DIAGNOSIS — F0280 Dementia in other diseases classified elsewhere without behavioral disturbance: Secondary | ICD-10-CM | POA: Diagnosis not present

## 2017-05-03 DIAGNOSIS — L89621 Pressure ulcer of left heel, stage 1: Secondary | ICD-10-CM | POA: Diagnosis not present

## 2017-05-03 DIAGNOSIS — N183 Chronic kidney disease, stage 3 (moderate): Secondary | ICD-10-CM | POA: Diagnosis not present

## 2017-05-03 DIAGNOSIS — M810 Age-related osteoporosis without current pathological fracture: Secondary | ICD-10-CM | POA: Diagnosis not present

## 2017-05-03 DIAGNOSIS — L89153 Pressure ulcer of sacral region, stage 3: Secondary | ICD-10-CM | POA: Diagnosis not present

## 2017-05-03 DIAGNOSIS — I129 Hypertensive chronic kidney disease with stage 1 through stage 4 chronic kidney disease, or unspecified chronic kidney disease: Secondary | ICD-10-CM | POA: Diagnosis not present

## 2017-05-03 DIAGNOSIS — G309 Alzheimer's disease, unspecified: Secondary | ICD-10-CM | POA: Diagnosis not present

## 2017-05-03 DIAGNOSIS — F0280 Dementia in other diseases classified elsewhere without behavioral disturbance: Secondary | ICD-10-CM | POA: Diagnosis not present

## 2017-05-03 DIAGNOSIS — Z9181 History of falling: Secondary | ICD-10-CM | POA: Diagnosis not present

## 2017-05-07 DIAGNOSIS — M810 Age-related osteoporosis without current pathological fracture: Secondary | ICD-10-CM | POA: Diagnosis not present

## 2017-05-07 DIAGNOSIS — F0280 Dementia in other diseases classified elsewhere without behavioral disturbance: Secondary | ICD-10-CM | POA: Diagnosis not present

## 2017-05-07 DIAGNOSIS — N183 Chronic kidney disease, stage 3 (moderate): Secondary | ICD-10-CM | POA: Diagnosis not present

## 2017-05-07 DIAGNOSIS — Z9181 History of falling: Secondary | ICD-10-CM | POA: Diagnosis not present

## 2017-05-07 DIAGNOSIS — L89621 Pressure ulcer of left heel, stage 1: Secondary | ICD-10-CM | POA: Diagnosis not present

## 2017-05-07 DIAGNOSIS — G309 Alzheimer's disease, unspecified: Secondary | ICD-10-CM | POA: Diagnosis not present

## 2017-05-07 DIAGNOSIS — L89153 Pressure ulcer of sacral region, stage 3: Secondary | ICD-10-CM | POA: Diagnosis not present

## 2017-05-07 DIAGNOSIS — I129 Hypertensive chronic kidney disease with stage 1 through stage 4 chronic kidney disease, or unspecified chronic kidney disease: Secondary | ICD-10-CM | POA: Diagnosis not present

## 2017-05-09 DIAGNOSIS — Z961 Presence of intraocular lens: Secondary | ICD-10-CM | POA: Diagnosis not present

## 2017-05-10 ENCOUNTER — Emergency Department (HOSPITAL_COMMUNITY): Payer: Medicare PPO

## 2017-05-10 ENCOUNTER — Inpatient Hospital Stay (HOSPITAL_COMMUNITY)
Admission: EM | Admit: 2017-05-10 | Discharge: 2017-05-16 | DRG: 853 | Disposition: A | Discharge status: Skilled Nursing Facility | Payer: Medicare PPO | Attending: Internal Medicine | Admitting: Internal Medicine

## 2017-05-10 ENCOUNTER — Encounter (HOSPITAL_COMMUNITY): Payer: Self-pay | Admitting: *Deleted

## 2017-05-10 DIAGNOSIS — L89153 Pressure ulcer of sacral region, stage 3: Secondary | ICD-10-CM | POA: Diagnosis not present

## 2017-05-10 DIAGNOSIS — N179 Acute kidney failure, unspecified: Secondary | ICD-10-CM | POA: Diagnosis not present

## 2017-05-10 DIAGNOSIS — G309 Alzheimer's disease, unspecified: Secondary | ICD-10-CM | POA: Diagnosis not present

## 2017-05-10 DIAGNOSIS — F0280 Dementia in other diseases classified elsewhere without behavioral disturbance: Secondary | ICD-10-CM | POA: Diagnosis not present

## 2017-05-10 DIAGNOSIS — R1319 Other dysphagia: Secondary | ICD-10-CM | POA: Diagnosis not present

## 2017-05-10 DIAGNOSIS — K5909 Other constipation: Secondary | ICD-10-CM

## 2017-05-10 DIAGNOSIS — F0390 Unspecified dementia without behavioral disturbance: Secondary | ICD-10-CM | POA: Diagnosis not present

## 2017-05-10 DIAGNOSIS — K5900 Constipation, unspecified: Secondary | ICD-10-CM | POA: Diagnosis present

## 2017-05-10 DIAGNOSIS — E1122 Type 2 diabetes mellitus with diabetic chronic kidney disease: Secondary | ICD-10-CM | POA: Diagnosis present

## 2017-05-10 DIAGNOSIS — L0231 Cutaneous abscess of buttock: Secondary | ICD-10-CM | POA: Diagnosis present

## 2017-05-10 DIAGNOSIS — D72829 Elevated white blood cell count, unspecified: Secondary | ICD-10-CM | POA: Diagnosis not present

## 2017-05-10 DIAGNOSIS — A419 Sepsis, unspecified organism: Secondary | ICD-10-CM | POA: Diagnosis not present

## 2017-05-10 DIAGNOSIS — E46 Unspecified protein-calorie malnutrition: Secondary | ICD-10-CM | POA: Diagnosis not present

## 2017-05-10 DIAGNOSIS — E440 Moderate protein-calorie malnutrition: Secondary | ICD-10-CM

## 2017-05-10 DIAGNOSIS — Z95828 Presence of other vascular implants and grafts: Secondary | ICD-10-CM | POA: Diagnosis not present

## 2017-05-10 DIAGNOSIS — R4182 Altered mental status, unspecified: Secondary | ICD-10-CM | POA: Diagnosis not present

## 2017-05-10 DIAGNOSIS — D649 Anemia, unspecified: Secondary | ICD-10-CM | POA: Diagnosis present

## 2017-05-10 DIAGNOSIS — Z79899 Other long term (current) drug therapy: Secondary | ICD-10-CM | POA: Diagnosis not present

## 2017-05-10 DIAGNOSIS — R41841 Cognitive communication deficit: Secondary | ICD-10-CM | POA: Diagnosis not present

## 2017-05-10 DIAGNOSIS — I96 Gangrene, not elsewhere classified: Secondary | ICD-10-CM | POA: Diagnosis not present

## 2017-05-10 DIAGNOSIS — F02818 Dementia in other diseases classified elsewhere, unspecified severity, with other behavioral disturbance: Secondary | ICD-10-CM | POA: Diagnosis present

## 2017-05-10 DIAGNOSIS — L8992 Pressure ulcer of unspecified site, stage 2: Secondary | ICD-10-CM | POA: Diagnosis not present

## 2017-05-10 DIAGNOSIS — A4151 Sepsis due to Escherichia coli [E. coli]: Secondary | ICD-10-CM | POA: Diagnosis not present

## 2017-05-10 DIAGNOSIS — Z1612 Extended spectrum beta lactamase (ESBL) resistance: Secondary | ICD-10-CM | POA: Diagnosis not present

## 2017-05-10 DIAGNOSIS — L89323 Pressure ulcer of left buttock, stage 3: Secondary | ICD-10-CM | POA: Diagnosis present

## 2017-05-10 DIAGNOSIS — E872 Acidosis: Secondary | ICD-10-CM | POA: Diagnosis not present

## 2017-05-10 DIAGNOSIS — B9620 Unspecified Escherichia coli [E. coli] as the cause of diseases classified elsewhere: Secondary | ICD-10-CM | POA: Diagnosis present

## 2017-05-10 DIAGNOSIS — L89621 Pressure ulcer of left heel, stage 1: Secondary | ICD-10-CM | POA: Diagnosis not present

## 2017-05-10 DIAGNOSIS — Z66 Do not resuscitate: Secondary | ICD-10-CM | POA: Diagnosis present

## 2017-05-10 DIAGNOSIS — E1152 Type 2 diabetes mellitus with diabetic peripheral angiopathy with gangrene: Secondary | ICD-10-CM | POA: Diagnosis not present

## 2017-05-10 DIAGNOSIS — L89159 Pressure ulcer of sacral region, unspecified stage: Secondary | ICD-10-CM | POA: Diagnosis not present

## 2017-05-10 DIAGNOSIS — G308 Other Alzheimer's disease: Secondary | ICD-10-CM | POA: Diagnosis not present

## 2017-05-10 DIAGNOSIS — M810 Age-related osteoporosis without current pathological fracture: Secondary | ICD-10-CM | POA: Diagnosis present

## 2017-05-10 DIAGNOSIS — I10 Essential (primary) hypertension: Secondary | ICD-10-CM | POA: Diagnosis not present

## 2017-05-10 DIAGNOSIS — L0291 Cutaneous abscess, unspecified: Secondary | ICD-10-CM | POA: Diagnosis not present

## 2017-05-10 DIAGNOSIS — Z9181 History of falling: Secondary | ICD-10-CM | POA: Diagnosis not present

## 2017-05-10 DIAGNOSIS — Z978 Presence of other specified devices: Secondary | ICD-10-CM | POA: Diagnosis not present

## 2017-05-10 DIAGNOSIS — I700 Atherosclerosis of aorta: Secondary | ICD-10-CM | POA: Diagnosis not present

## 2017-05-10 DIAGNOSIS — R402441 Other coma, without documented Glasgow coma scale score, or with partial score reported, in the field [EMT or ambulance]: Secondary | ICD-10-CM | POA: Diagnosis not present

## 2017-05-10 DIAGNOSIS — L89154 Pressure ulcer of sacral region, stage 4: Secondary | ICD-10-CM | POA: Diagnosis not present

## 2017-05-10 DIAGNOSIS — F0281 Dementia in other diseases classified elsewhere with behavioral disturbance: Secondary | ICD-10-CM | POA: Diagnosis present

## 2017-05-10 DIAGNOSIS — I129 Hypertensive chronic kidney disease with stage 1 through stage 4 chronic kidney disease, or unspecified chronic kidney disease: Secondary | ICD-10-CM | POA: Diagnosis present

## 2017-05-10 DIAGNOSIS — R7989 Other specified abnormal findings of blood chemistry: Secondary | ICD-10-CM

## 2017-05-10 DIAGNOSIS — Z880 Allergy status to penicillin: Secondary | ICD-10-CM | POA: Diagnosis not present

## 2017-05-10 DIAGNOSIS — B952 Enterococcus as the cause of diseases classified elsewhere: Secondary | ICD-10-CM | POA: Diagnosis not present

## 2017-05-10 DIAGNOSIS — L8990 Pressure ulcer of unspecified site, unspecified stage: Secondary | ICD-10-CM | POA: Diagnosis not present

## 2017-05-10 DIAGNOSIS — N183 Chronic kidney disease, stage 3 unspecified: Secondary | ICD-10-CM | POA: Diagnosis present

## 2017-05-10 DIAGNOSIS — M6281 Muscle weakness (generalized): Secondary | ICD-10-CM | POA: Diagnosis not present

## 2017-05-10 DIAGNOSIS — Z91018 Allergy to other foods: Secondary | ICD-10-CM | POA: Diagnosis not present

## 2017-05-10 DIAGNOSIS — R740 Nonspecific elevation of levels of transaminase and lactic acid dehydrogenase [LDH]: Secondary | ICD-10-CM | POA: Diagnosis not present

## 2017-05-10 LAB — BASIC METABOLIC PANEL
Anion gap: 10 (ref 5–15)
BUN: 31 mg/dL — AB (ref 6–20)
CHLORIDE: 108 mmol/L (ref 101–111)
CO2: 24 mmol/L (ref 22–32)
CREATININE: 1.09 mg/dL — AB (ref 0.44–1.00)
Calcium: 8.7 mg/dL — ABNORMAL LOW (ref 8.9–10.3)
GFR calc Af Amer: 52 mL/min — ABNORMAL LOW (ref >60)
GFR calc non Af Amer: 45 mL/min — ABNORMAL LOW (ref >60)
GLUCOSE: 116 mg/dL — AB (ref 65–99)
POTASSIUM: 4.5 mmol/L (ref 3.5–5.1)
Sodium: 142 mmol/L (ref 135–145)

## 2017-05-10 LAB — CBC
HCT: 30.2 % — ABNORMAL LOW (ref 36.0–46.0)
HEMOGLOBIN: 9.9 g/dL — AB (ref 12.0–15.0)
MCH: 30.9 pg (ref 26.0–34.0)
MCHC: 32.8 g/dL (ref 30.0–36.0)
MCV: 94.4 fL (ref 78.0–100.0)
Platelets: 397 10*3/uL (ref 150–400)
RBC: 3.2 MIL/uL — AB (ref 3.87–5.11)
RDW: 13.7 % (ref 11.5–15.5)
WBC: 21.6 10*3/uL — ABNORMAL HIGH (ref 4.0–10.5)

## 2017-05-10 LAB — I-STAT CG4 LACTIC ACID, ED: Lactic Acid, Venous: 2.38 mmol/L (ref 0.5–1.9)

## 2017-05-10 MED ORDER — RISPERIDONE 0.25 MG PO TABS
0.2500 mg | ORAL_TABLET | Freq: Two times a day (BID) | ORAL | Status: DC
  Administered 2017-05-10 – 2017-05-16 (×11): 0.25 mg via ORAL
  Filled 2017-05-10 (×5): qty 1
  Filled 2017-05-10: qty 0.5
  Filled 2017-05-10 (×6): qty 1
  Filled 2017-05-10: qty 0.5

## 2017-05-10 MED ORDER — POLYETHYLENE GLYCOL 3350 17 G PO PACK
34.0000 g | PACK | Freq: Two times a day (BID) | ORAL | Status: DC
  Administered 2017-05-10 – 2017-05-15 (×9): 34 g via ORAL
  Filled 2017-05-10 (×10): qty 2

## 2017-05-10 MED ORDER — CYCLOBENZAPRINE HCL 5 MG PO TABS
5.0000 mg | ORAL_TABLET | Freq: Two times a day (BID) | ORAL | Status: DC | PRN
  Administered 2017-05-11: 5 mg via ORAL
  Filled 2017-05-10: qty 1

## 2017-05-10 MED ORDER — IOPAMIDOL (ISOVUE-300) INJECTION 61%
INTRAVENOUS | Status: AC
  Administered 2017-05-10: 100 mL
  Filled 2017-05-10: qty 100

## 2017-05-10 MED ORDER — SODIUM CHLORIDE 0.9 % IV BOLUS (SEPSIS)
500.0000 mL | Freq: Once | INTRAVENOUS | Status: AC
  Administered 2017-05-10: 500 mL via INTRAVENOUS

## 2017-05-10 MED ORDER — POLYETHYLENE GLYCOL 3350 17 G PO PACK: 17.0000 g | PACK | Freq: Every day | ORAL | Status: DC

## 2017-05-10 MED ORDER — HEPARIN SODIUM (PORCINE) 5000 UNIT/ML IJ SOLN
5000.0000 [IU] | Freq: Three times a day (TID) | INTRAMUSCULAR | Status: DC
  Administered 2017-05-10 – 2017-05-11 (×2): 5000 [IU] via SUBCUTANEOUS
  Filled 2017-05-10 (×2): qty 1

## 2017-05-10 MED ORDER — SODIUM CHLORIDE 0.9 % IJ SOLN
INTRAMUSCULAR | Status: AC
  Filled 2017-05-10: qty 50

## 2017-05-10 MED ORDER — SODIUM CHLORIDE 0.9 % IV SOLN
2.0000 g | Freq: Once | INTRAVENOUS | Status: AC
  Administered 2017-05-10: 2 g via INTRAVENOUS
  Filled 2017-05-10: qty 2

## 2017-05-10 MED ORDER — LORATADINE 10 MG PO TABS
10.0000 mg | ORAL_TABLET | Freq: Every day | ORAL | Status: DC
  Administered 2017-05-12 – 2017-05-16 (×5): 10 mg via ORAL
  Filled 2017-05-10 (×5): qty 1

## 2017-05-10 MED ORDER — VITAMIN D 1000 UNITS PO TABS
1000.0000 [IU] | ORAL_TABLET | Freq: Every day | ORAL | Status: DC
  Administered 2017-05-12 – 2017-05-16 (×5): 1000 [IU] via ORAL
  Filled 2017-05-10 (×6): qty 1

## 2017-05-10 MED ORDER — SODIUM CHLORIDE 0.9 % IV SOLN
1.0000 g | INTRAVENOUS | Status: DC
  Administered 2017-05-11 – 2017-05-12 (×2): 1 g via INTRAVENOUS
  Filled 2017-05-10 (×3): qty 1

## 2017-05-10 MED ORDER — BISACODYL 10 MG RE SUPP
10.0000 mg | Freq: Every day | RECTAL | Status: DC
Start: 2017-05-10 — End: 2017-05-16
  Administered 2017-05-10 – 2017-05-15 (×5): 10 mg via RECTAL
  Filled 2017-05-10 (×5): qty 1

## 2017-05-10 MED ORDER — SODIUM CHLORIDE 0.9 % IV BOLUS (SEPSIS): 1000.0000 mL | Freq: Once | INTRAVENOUS | Status: DC

## 2017-05-10 MED ORDER — METRONIDAZOLE IN NACL 5-0.79 MG/ML-% IV SOLN
500.0000 mg | Freq: Three times a day (TID) | INTRAVENOUS | Status: DC
  Administered 2017-05-11 – 2017-05-13 (×7): 500 mg via INTRAVENOUS
  Filled 2017-05-10 (×7): qty 100

## 2017-05-10 MED ORDER — DONEPEZIL HCL 10 MG PO TABS
10.0000 mg | ORAL_TABLET | Freq: Every day | ORAL | Status: DC
  Administered 2017-05-10 – 2017-05-15 (×6): 10 mg via ORAL
  Filled 2017-05-10 (×3): qty 1
  Filled 2017-05-10: qty 2
  Filled 2017-05-10 (×2): qty 1

## 2017-05-10 MED ORDER — LISINOPRIL 20 MG PO TABS: 30.0000 mg | ORAL_TABLET | Freq: Every day | ORAL | Status: DC

## 2017-05-10 MED ORDER — VANCOMYCIN HCL 10 G IV SOLR
1250.0000 mg | Freq: Once | INTRAVENOUS | Status: AC
  Administered 2017-05-10: 1250 mg via INTRAVENOUS
  Filled 2017-05-10: qty 1250

## 2017-05-10 MED ORDER — VANCOMYCIN HCL 500 MG IV SOLR
500.0000 mg | Freq: Once | INTRAVENOUS | Status: DC
  Filled 2017-05-10: qty 500

## 2017-05-10 MED ORDER — SODIUM CHLORIDE 0.9 % IV BOLUS (SEPSIS)
1000.0000 mL | Freq: Once | INTRAVENOUS | Status: AC
  Administered 2017-05-10: 1000 mL via INTRAVENOUS

## 2017-05-10 MED ORDER — POLYETHYLENE GLYCOL 3350 17 G PO PACK: 17.0000 g | PACK | Freq: Every day | ORAL | Status: DC | PRN

## 2017-05-10 MED ORDER — METRONIDAZOLE IN NACL 5-0.79 MG/ML-% IV SOLN
500.0000 mg | Freq: Once | INTRAVENOUS | Status: AC
  Administered 2017-05-10: 500 mg via INTRAVENOUS
  Filled 2017-05-10: qty 100

## 2017-05-10 MED ORDER — ONDANSETRON 4 MG PO TBDP: 4.0000 mg | ORAL_TABLET | Freq: Four times a day (QID) | ORAL | Status: DC | PRN

## 2017-05-10 MED ORDER — VANCOMYCIN HCL 500 MG IV SOLR
500.0000 mg | Freq: Once | INTRAVENOUS | Status: AC
  Administered 2017-05-10: 500 mg via INTRAVENOUS
  Filled 2017-05-10 (×2): qty 500

## 2017-05-10 MED ORDER — LIDOCAINE HCL (PF) 2 % IJ SOLN
0.0000 mL | Freq: Once | INTRAMUSCULAR | Status: DC | PRN
  Filled 2017-05-10: qty 20

## 2017-05-10 MED ORDER — SODIUM CHLORIDE 0.9 % IV SOLN
INTRAVENOUS | Status: DC
  Administered 2017-05-10: 100 mL/h via INTRAVENOUS
  Administered 2017-05-12 – 2017-05-16 (×5): via INTRAVENOUS

## 2017-05-10 MED ORDER — ACETAMINOPHEN 500 MG PO TABS
500.0000 mg | ORAL_TABLET | Freq: Two times a day (BID) | ORAL | Status: DC
  Administered 2017-05-10 – 2017-05-16 (×11): 500 mg via ORAL
  Filled 2017-05-10 (×11): qty 1

## 2017-05-10 MED ORDER — MEMANTINE HCL 10 MG PO TABS
5.0000 mg | ORAL_TABLET | Freq: Every day | ORAL | Status: DC
Start: 2017-05-11 — End: 2017-05-16
  Administered 2017-05-12 – 2017-05-16 (×5): 5 mg via ORAL
  Filled 2017-05-10 (×8): qty 1

## 2017-05-10 MED ORDER — VANCOMYCIN HCL 10 G IV SOLR
1250.0000 mg | INTRAVENOUS | Status: DC
  Administered 2017-05-12: 1250 mg via INTRAVENOUS
  Filled 2017-05-10 (×2): qty 1250

## 2017-05-10 NOTE — ED Provider Notes (Signed)
Surgery called.  Dr. Michaell CowingGross advised pt will be seen for consult in am.    Caroline CheeksSofia, Caroline Caras K, PA-C 05/10/17 1831    Bethann BerkshireZammit, Joseph, MD 05/10/17 [phone]
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
## 2017-05-10 NOTE — Progress Notes (Addendum)
A consult was received from an ED physician for vancomycin and cefepime and flagyl per pharmacy dosing.  The patient's profile has been reviewed for ht/wt/allergies/indication/available labs.   A one time order has been placed for vancomycin 1250 mg and cefepime 2 gm and flagyl  500 mg IV x1 - pt with PCN allergy of hives - pt unable to provide history  Further antibiotics/pharmacy consults should be ordered by admitting physician if indicated.                       Thank you, Herby AbrahamMichelle T. Madlyn Crosby, Pharm.D. [phone] 05/10/2017 4:10 PM
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
## 2017-05-10 NOTE — ED Triage Notes (Signed)
Per EMS, pt from [facility] complains of 3 abscesses/pressure ulcers. Pressure ulcers are on left and right buttock and sacral area. Pt was sent to have ulcers evaluated by nursing home staff. Pt oriented to everything but time, which is her baseline. Pt has hx of dementia.

## 2017-05-10 NOTE — ED Provider Notes (Signed)
  Face-to-face evaluation   History: She presents for evaluation of "bedsores", unknown onset time.  Physical exam: Elderly female alert but confused.  Scattered sacral decubiti, with 1 draining abscess left buttock, about 3 x 4 cm.  The wound is draining mucopurulent material.  Stage III sacral decubitus with necrosis, overlying lower sacral region.  See image on attached documentation  Medical screening examination/treatment/procedure(s) were conducted as a shared visit with non-physician practitioner(s) and myself.  I personally evaluated the patient during the encounter   Mancel BaleWentz, Leinaala Catanese, MD 05/11/17 1214

## 2017-05-10 NOTE — Progress Notes (Signed)
Pharmacy Antibiotic Note  Caroline JohnsClara J Wallace is a 84 y.o. female admitted on 05/10/2017 with left buttock abscess.  Pharmacy has been consulted for vancomycin, cefepime and flagyl dosing. Wt was initially recorded in Epic as 59 kg then updated to 70.3 kg. She has an allergy of hives to PCN and pt unable to provide further allergy information.   Plan: Flagyl 500 mg IV q8h Cefepime 2 gm IV x 1 in ED then cefepime 1 gm IV q24 Vancomycin 1250 mg IV x 1 given in ED, give additional  Vancomycin 500 mg to make total loading dose = 1750 mg Then start vancomycin 1250 mg IV q48 for est AUC 435 using SCr 1.09 and Wt 70.3 kg (IBW/TBW).  F/u renal fxn, wbc, temp, culture data Vancomycin levels as needed  Height: 5\' 4"  (162.6 cm) Weight: 155 lb (70.3 kg) IBW/kg (Calculated) : 54.7  Temp (24hrs), Avg:98.8 F (37.1 C), Min:98.8 F (37.1 C), Max:98.8 F (37.1 C)  Recent Labs  Lab 05/10/17 1434 05/10/17 1621  WBC 21.6*  --   CREATININE 1.09*  --   LATICACIDVEN  --  2.38*    Estimated Creatinine Clearance: 36.3 mL/min (A) (by C-G formula based on SCr of 1.09 mg/dL (H)).    Allergies  Allergen Reactions  . Apple   . Penicillins Hives    Thank you for allowing pharmacy to be a part of this patient's care.  Herby AbrahamMichelle T. Faatimah Spielberg, Pharm.D. [phone] 05/10/2017 6:24 PM

## 2017-05-10 NOTE — H&P (Signed)
History and Physical    [name] ZOX:[id number] DOB: 03/09/1930 DOA: 05/10/2017  PCP: Natalia Leatherwood, DO   Patient coming from: [facility]  facilty  Chief Complaint: Sent for the evaluation of worsening sacral ulcer  HPI: Caroline Wallace is a 84 y.o. female with medical history significant of advanced dementia, hypertension, CKD stage III, osteoporosis, nursing facility resident who was sent to the emergency department for the evaluation of worsening sacral ulcer and a new  draining abscess from the left buttock.  It was reported that the ulcer was getting worse and there was also spontaneous drainage from her left buttock since l a week and half.  Nurses noted that there was drainage of pus and blood from the abscess site.  Family member reported that she has known pressure ulcers and she is being followed by wound care there 3 times a week.  The left buttock abscess appears to be new finding.  There was no documentation of patient being febrile. Patient has history of advanced dementia.  She is a very poor historian. Patient looked comfortable during my evaluation.  She denies any pain on her back. She is afebrile. Her vitals were stable.  There was foul-smelling draining abscess from her left buttock along with stage III sacral ulcer.   CT imaging showed sacral decubitus ulcer with gas and fluid-filled tracts extending to the posterior sacrum and coccyx. No osseous erosion to suggest Osteomyelitis,3.6 cm subcutaneous abscess in the inferior left gluteal region.  ED Course: Patient was found to have elevated white cell counts, elevated lactic acid level.She was started on IV fluids.  Antibiotics were started.  General surgery was consulted.   Review of Systems: As per HPI otherwise 10 point review of systems negative.    Past Medical History:  Diagnosis Date  . Allergy   . Altered mental status 2017  . CKD (chronic kidney disease), stage II    GFR 60s  . Dementia 2017  .  Diabetes mellitus without complication (HCC)   . Hypertension   . Osteoporosis   . Vitamin D deficiency     History reviewed. No pertinent surgical history.   reports that  has never smoked. she has never used smokeless tobacco. She reports that she does not drink alcohol or use drugs.  Allergies  Allergen Reactions  . Apple   . Penicillins Hives    Family History  Problem Relation Age of Onset  . Lung cancer Mother      Prior to Admission medications   Medication Sig Start Date End Date Taking? Authorizing Provider  acetaminophen (TYLENOL) 500 MG tablet Take 500 mg by mouth 2 (two) times daily.   Yes [provider]  Cholecalciferol (VITAMIN D-1000 MAX ST) 1000 units tablet Take 1,000 Units by mouth daily.   Yes [provider]  cyclobenzaprine (FLEXERIL) 5 MG tablet Take 1 tablet (5 mg total) by mouth 2 (two) times daily as needed for muscle spasms. 11/02/16  Yes Alvira Monday, MD  donepezil (ARICEPT) 10 MG tablet Take 10 mg by mouth at bedtime.   Yes [provider]  lisinopril (PRINIVIL,ZESTRIL) 30 MG tablet Take 1 tablet (30 mg total) by mouth daily. 03/28/16  Yes Kuneff, Renee A, DO  loratadine (CLARITIN) 10 MG tablet Take 1 tablet (10 mg total) by mouth daily. 11/25/15  Yes Kuneff, Renee A, DO  memantine (NAMENDA) 5 MG tablet Take 5 mg by mouth daily.   Yes [provider]  ondansetron (ZOFRAN-ODT) 4  MG disintegrating tablet Take 4 mg by mouth every 6 (six) hours as needed for nausea. 02/26/17  Yes [provider]  risperiDONE (RISPERDAL) 0.25 MG tablet Take 0.25 mg by mouth 2 (two) times daily.   Yes [provider]  trolamine salicylate (ASPERCREME) 10 % cream Apply 1 application topically 2 (two) times daily.   Yes [provider]    Physical Exam: Vitals:   05/10/17 1247 05/10/17 1421 05/10/17 1606 05/10/17 1609  BP: (!) 106/59 116/63  115/60  Pulse: 74 69  83  Resp: 18 16  16   Temp: 98.8 F (37.1 C)      TempSrc: Oral     SpO2: 100% 100%  100%  Weight:   70.3 kg (155 lb)   Height:   5\' 4"  (1.626 m)     Constitutional: NAD, calm, comfortable Vitals:   05/10/17 1247 05/10/17 1421 05/10/17 1606 05/10/17 1609  BP: (!) 106/59 116/63  115/60  Pulse: 74 69  83  Resp: 18 16  16   Temp: 98.8 F (37.1 C)     TempSrc: Oral     SpO2: 100% 100%  100%  Weight:   70.3 kg (155 lb)   Height:   5\' 4"  (1.626 m)    Eyes: PERRL, lids and conjunctivae normal ENMT: Mucous membranes are moist. Posterior pharynx clear of any exudate or lesions.Normal dentition.  Neck: normal, supple, no masses, no thyromegaly Respiratory: clear to auscultation bilaterally, no wheezing, no crackles. Normal respiratory effort. No accessory muscle use.  Cardiovascular: Regular rate and rhythm, no murmurs / rubs / gallops. No extremity edema. 2+ pedal pulses. No carotid bruits.  Abdomen: no tenderness, no masses palpated. No hepatosplenomegaly. Bowel sounds positive.  Musculoskeletal: no clubbing / cyanosis. No joint deformity upper and lower extremities. Good ROM, no contractures. Normal muscle tone.  Skin: no rashes, lesions,No induration Stage 3 sacral decubitus,left buttock draining abscess,foul smell Neurologic: CN 2-12 grossly intact. Sensation intact, DTR normal. Strength 5/5 in all 4.  Psychiatric: Pleasantly demented  Foley Catheter: None  Labs on Admission: I have personally reviewed following labs and imaging studies  CBC: Recent Labs  Lab 05/10/17 1434  WBC 21.6*  HGB 9.9*  HCT 30.2*  MCV 94.4  PLT 397   Basic Metabolic Panel: Recent Labs  Lab 05/10/17 1434  NA 142  K 4.5  CL 108  CO2 24  GLUCOSE 116*  BUN 31*  CREATININE 1.09*  CALCIUM 8.7*   GFR: Estimated Creatinine Clearance: 36.3 mL/min (A) (by C-G formula based on SCr of 1.09 mg/dL (H)). Liver Function Tests: No results for input(s): AST, ALT, ALKPHOS, BILITOT, PROT, ALBUMIN in the last 168 hours. No results for input(s): LIPASE,  AMYLASE in the last 168 hours. No results for input(s): AMMONIA in the last 168 hours. Coagulation Profile: No results for input(s): INR, PROTIME in the last 168 hours. Cardiac Enzymes: No results for input(s): CKTOTAL, CKMB, CKMBINDEX, TROPONINI in the last 168 hours. BNP (last 3 results) No results for input(s): PROBNP in the last 8760 hours. HbA1C: No results for input(s): HGBA1C in the last 72 hours. CBG: No results for input(s): GLUCAP in the last 168 hours. Lipid Profile: No results for input(s): CHOL, HDL, LDLCALC, TRIG, CHOLHDL, LDLDIRECT in the last 72 hours. Thyroid Function Tests: No results for input(s): TSH, T4TOTAL, FREET4, T3FREE, THYROIDAB in the last 72 hours. Anemia Panel: No results for input(s): VITAMINB12, FOLATE, FERRITIN, TIBC, IRON, RETICCTPCT in the last 72 hours. Urine analysis: No results found  for: COLORURINE, APPEARANCEUR, LABSPEC, PHURINE, GLUCOSEU, HGBUR, BILIRUBINUR, KETONESUR, PROTEINUR, UROBILINOGEN, NITRITE, LEUKOCYTESUR  Radiological Exams on Admission: Ct Abdomen Pelvis W Contrast  Result Date: 05/10/2017 CLINICAL DATA:  Sacral/buttock ulcers. EXAM: CT ABDOMEN AND PELVIS WITH CONTRAST TECHNIQUE: Multidetector CT imaging of the abdomen and pelvis was performed using the standard protocol following bolus administration of intravenous contrast. CONTRAST:  100mL ISOVUE-300 IOPAMIDOL (ISOVUE-300) INJECTION 61% COMPARISON:  Abdominal radiographs 01/06/2016 FINDINGS: Lower chest: Motion artifact through the lung bases with mild bibasilar scarring and/or atelectasis. No pleural effusion. Three-vessel coronary artery atherosclerosis. Hepatobiliary: Slight exclusion of the right hepatic dome. No focal liver abnormality identified. Unremarkable gallbladder. No biliary dilatation. Pancreas: Unremarkable. Spleen: Unremarkable. Adrenals/Urinary Tract: Unremarkable adrenal glands. 4.5 cm left upper pole renal cyst with a few thin internal septations. No renal calculi or  hydronephrosis. Decompressed bladder. Stomach/Bowel: Small sliding hiatal hernia. Large amount of stool in the rectum with a moderate amount of stool in the colon. Redundant sigmoid colon. No evidence of bowel obstruction. Unremarkable appendix. Vascular/Lymphatic: Diffuse atherosclerosis and tortuosity of the abdominal aorta without aneurysm. Suspected at least moderate stenoses of the celiac and superior mesenteric artery origins. No enlarged lymph nodes. Reproductive: Thickened appearance of the endometrium. 4.3 x 2.5 cm homogeneously hyperattenuating or enhancing left adnexal mass, possibly arising from the left ovary. Grossly unremarkable right ovary. Other: No intraperitoneal free fluid.  No abdominal wall hernia. Musculoskeletal: Sacral decubitus ulcer to the right of midline contains gas and a small amount of fluid and extends to the posterior margin of the lower sacrum and coccyx without evidence of underlying osseous erosion. Additional gas extends more superiorly posterior to the sacrum without significant fluid component. There is also a 2.2 x 1.6 cm gas and fluid collection posterior to the tip of the coccyx which is contiguous with the above described ulcer more superiorly. Surrounding soft tissue thickening and inflammatory stranding are present. More inferiorly, there is a 3.6 x 2.2 cm rim enhancing subcutaneous fluid collection in the inferior left gluteal region (series 2, image 84). Mild diffuse lumbar disc degeneration is noted. IMPRESSION: 1. Sacral decubitus ulcer with gas and fluid-filled tracts extending to the posterior sacrum and coccyx. No osseous erosion to suggest osteomyelitis. 2. 3.6 cm subcutaneous abscess in the inferior left gluteal region. 3. Large volume rectal stool. 4. Abnormal thickened appearance of the endometrium with 4.3 cm left adnexal mass. Nonemergent pelvic ultrasound is recommended for further evaluation. 5. Minimally complex 4.5 cm left renal cyst. 6.  Aortic  Atherosclerosis (ICD10-I70.0). Electronically Signed   By: Sebastian AcheAllen  Grady M.D.   On: 05/10/2017 17:22     Assessment/Plan Principal Problem:   Sepsis (HCC) Active Problems:   CKD (chronic kidney disease) stage 3, GFR 30-59 ml/min (HCC)   Essential hypertension   Alzheimer's dementia with behavioral disturbance   Sacral decubitus ulcer, stage III (HCC)   Abscess   Leucocytosis   Elevated lactic acid level  Sepsis: Presented with elevated lactic acid level and white cell counts.  Will start on broad-spectrum antibiotics with vancomycin, cefepime and Zosyn.  Patient is allergic to penicillin.  Blood pressure is currently stable. CT imaging was not suggestive of osteomyelitis. Cultures have been sent, will follow up  Stage III sacral decubitus ulcer/draining abscess of the left buttock: Ulcers look infected, foul-smelling.  Probably needs incision and drainage/debridement.  Surgery consulted. We will also request for wound care evaluation.    Lactic acidosis: We will continue to monitor lactic acid level.  Continue IV fluids  AKI  on CKD Stage 3: Kidney function should be close to baseline.  Continue IV fluids  Constipation:Large volume rectal stool noted in CT. Started on daily miralax.  Abnormal finding on CT: Showed Abnormal thickened appearance of the endometrium with 4.3 cm left adnexal mass. Nonemergent pelvic ultrasound has been recommended for further evaluation radiology which can be ordered later after discussion with family.  The importance of further exploration on this finding is questionable on this elderly female.  Advanced dementia: Patient is alert but confused.  Might be this is her baseline.  We will continue to monitor her mental status.  We will resume her home dementia meds.  Hypertension: Currently blood pressure stable.  On lisinopril at home which has been held.  We will continue to monitor her blood pressure.  Severity of Illness:   I certify that at the point  of admission it is my clinical judgment that the patient will require inpatient hospital care spanning beyond 2 midnights from the point of admission due to high intensity of service, high risk for further deterioration and high frequency of surveillance required.    DVT prophylaxis: Heparin [place] Code Status: Full Family Communication: None present at the bedside Consults called: General surgery     Meredith Leeds MD [facility] Pager [phone]  If 7PM-7AM, please contact night-coverage [email or web address] Password [facility]  05/10/2017, 6:15 PM

## 2017-05-10 NOTE — ED Notes (Signed)
ED TO INPATIENT HANDOFF REPORT  Name/Age/Gender Caroline Wallace 84 y.o. female  Code Status    Code Status Orders  (From admission, onward)        Start     Ordered   05/10/17 1814  Full code  Continuous     05/10/17 1814    Code Status History    Date Active Date Inactive Code Status Order ID Comments User Context   This patient has a current code status but no historical code status.      Home/SNF/Other Nursing Home  Chief Complaint Bed Sores  Level of Care/Admitting Diagnosis ED Disposition    ED Disposition Condition [facility] Area: [facility] [[id number]]  Level of Care: Med-Surg [16]  Diagnosis: Sepsis [facility]) [[id number]]  Admitting Physician: Marene Lenz [[id number]]  Attending Physician: Jodie Echevaria, AMRIT [[id number]]  Estimated length of stay: past midnight tomorrow  Certification:: I certify this patient will need inpatient services for at least 2 midnights  PT Class (Do Not Modify): Inpatient [101]  PT Acc Code (Do Not Modify): Private [1]       Medical History Past Medical History:  Diagnosis Date  . Allergy   . Altered mental status 2017  . CKD (chronic kidney disease), stage II    GFR 60s  . Dementia 2017  . Diabetes mellitus without complication ([place])   . Hypertension   . Osteoporosis   . Vitamin D deficiency     Allergies Allergies  Allergen Reactions  . Apple   . Penicillins Hives    IV Location/Drains/Wounds Patient Lines/Drains/Airways Status   Active Line/Drains/Airways    Name:   Placement date:   Placement time:   Site:   Days:   Peripheral IV 05/10/17 Left Antecubital   05/10/17    1924    Antecubital   less than 1   Peripheral IV 05/10/17 Right Wrist   05/10/17    1924    Wrist   less than 1          Labs/Imaging Results for orders placed or performed during the hospital encounter of 05/10/17 (from the past 48 hour(s))  CBC     Status: Abnormal   Collection Time: 05/10/17  2:34 PM   Result Value Ref Range   WBC 21.6 (H) 4.0 - 10.5 K/uL   RBC 3.20 (L) 3.87 - 5.11 MIL/uL   Hemoglobin 9.9 (L) 12.0 - 15.0 g/dL   HCT 30.2 (L) 36.0 - 46.0 %   MCV 94.4 78.0 - 100.0 fL   MCH 30.9 26.0 - 34.0 pg   MCHC 32.8 30.0 - 36.0 g/dL   RDW 13.7 11.5 - 15.5 %   Platelets 397 150 - 400 K/uL    Comment: Performed at [facility], [place] [address], [place], [place] [address]  Basic metabolic panel     Status: Abnormal   Collection Time: 05/10/17  2:34 PM  Result Value Ref Range   Sodium 142 135 - 145 mmol/L   Potassium 4.5 3.5 - 5.1 mmol/L   Chloride 108 101 - 111 mmol/L   CO2 24 22 - 32 mmol/L   Glucose, Bld 116 (H) 65 - 99 mg/dL   BUN 31 (H) 6 - 20 mg/dL   Creatinine, Ser 1.09 (H) 0.44 - 1.00 mg/dL   Calcium 8.7 (L) 8.9 - 10.3 mg/dL   GFR calc non Af Amer 45 (L) >60 mL/min   GFR calc Af Amer 52 (L) >  60 mL/min    Comment: (NOTE) The eGFR has been calculated using the CKD EPI equation. This calculation has not been validated in all clinical situations. eGFR's persistently <60 mL/min signify possible Chronic Kidney Disease.    Anion gap 10 5 - 15    Comment: Performed at [facility], [place] [address] [place], [place] [address]  I-Stat CG4 Lactic Acid, ED     Status: Abnormal   Collection Time: 05/10/17  4:21 PM  Result Value Ref Range   Lactic Acid, Venous 2.38 (HH) 0.5 - 1.9 mmol/L   Comment NOTIFIED PHYSICIAN    Ct Abdomen Pelvis W Contrast  Result Date: 05/10/2017 CLINICAL DATA:  Sacral/buttock ulcers. EXAM: CT ABDOMEN AND PELVIS WITH CONTRAST TECHNIQUE: Multidetector CT imaging of the abdomen and pelvis was performed using the standard protocol following bolus administration of intravenous contrast. CONTRAST:  138m ISOVUE-300 IOPAMIDOL (ISOVUE-300) INJECTION 61% COMPARISON:  Abdominal radiographs 01/06/2016 FINDINGS: Lower chest: Motion artifact through the lung bases with mild bibasilar scarring and/or atelectasis. No pleural effusion.  Three-vessel coronary artery atherosclerosis. Hepatobiliary: Slight exclusion of the right hepatic dome. No focal liver abnormality identified. Unremarkable gallbladder. No biliary dilatation. Pancreas: Unremarkable. Spleen: Unremarkable. Adrenals/Urinary Tract: Unremarkable adrenal glands. 4.5 cm left upper pole renal cyst with a few thin internal septations. No renal calculi or hydronephrosis. Decompressed bladder. Stomach/Bowel: Small sliding hiatal hernia. Large amount of stool in the rectum with a moderate amount of stool in the colon. Redundant sigmoid colon. No evidence of bowel obstruction. Unremarkable appendix. Vascular/Lymphatic: Diffuse atherosclerosis and tortuosity of the abdominal aorta without aneurysm. Suspected at least moderate stenoses of the celiac and superior mesenteric artery origins. No enlarged lymph nodes. Reproductive: Thickened appearance of the endometrium. 4.3 x 2.5 cm homogeneously hyperattenuating or enhancing left adnexal mass, possibly arising from the left ovary. Grossly unremarkable right ovary. Other: No intraperitoneal free fluid.  No abdominal wall hernia. Musculoskeletal: Sacral decubitus ulcer to the right of midline contains gas and a small amount of fluid and extends to the posterior margin of the lower sacrum and coccyx without evidence of underlying osseous erosion. Additional gas extends more superiorly posterior to the sacrum without significant fluid component. There is also a 2.2 x 1.6 cm gas and fluid collection posterior to the tip of the coccyx which is contiguous with the above described ulcer more superiorly. Surrounding soft tissue thickening and inflammatory stranding are present. More inferiorly, there is a 3.6 x 2.2 cm rim enhancing subcutaneous fluid collection in the inferior left gluteal region (series 2, image 84). Mild diffuse lumbar disc degeneration is noted. IMPRESSION: 1. Sacral decubitus ulcer with gas and fluid-filled tracts extending to the  posterior sacrum and coccyx. No osseous erosion to suggest osteomyelitis. 2. 3.6 cm subcutaneous abscess in the inferior left gluteal region. 3. Large volume rectal stool. 4. Abnormal thickened appearance of the endometrium with 4.3 cm left adnexal mass. Nonemergent pelvic ultrasound is recommended for further evaluation. 5. Minimally complex 4.5 cm left renal cyst. 6.  Aortic Atherosclerosis (ICD10-I70.0). Electronically Signed   By: ALogan BoresM.D.   On: 05/10/2017 17:22    Pending Labs Unresulted Labs (From admission, onward)   Start     Ordered   05/11/17 0500  Lactic acid, plasma  Tomorrow morning,   R     05/10/17 1803   05/11/17 0500  CBC  Tomorrow morning,   R     05/10/17 1814   05/11/17 0500  Comprehensive metabolic panel  Tomorrow morning,  R     05/10/17 1814   05/11/17 0500  Magnesium  Tomorrow morning,   R     05/10/17 1815   05/10/17 1813  CBC  (heparin)  Once,   R    Comments:  Baseline for heparin therapy IF NOT ALREADY DRAWN.  Notify MD if PLT < 100 K.    05/10/17 1814   05/10/17 1813  Creatinine, serum  (heparin)  Once,   R    Comments:  Baseline for heparin therapy IF NOT ALREADY DRAWN.    05/10/17 1814   05/10/17 1601  Blood culture (routine x 2)  BLOOD CULTURE X 2,   STAT     05/10/17 1602   05/10/17 1546  Urinalysis, Routine w reflex microscopic  Once,   R     05/10/17 1545   05/10/17 1546  Urine culture  STAT,   STAT     05/10/17 1545      Vitals/Pain Today's Vitals   05/10/17 1421 05/10/17 1606 05/10/17 1609 05/10/17 1850  BP: 116/63  115/60 (!) 116/56  Pulse: 69  83 84  Resp: _0 Temp:      TempSrc:      SpO2: 100%  100% 97%  Weight:  155 lb (70.3 kg)    Height:  _1  (1.626 m)      Isolation Precautions No active isolations  Medications Medications  sodium chloride 0.9 % injection (not administered)  sodium chloride 0.9 % bolus 500 mL (500 mLs Intravenous New Bag/Given 05/10/17 1917)  metroNIDAZOLE (FLAGYL) IVPB 500 mg (not  administered)  ceFEPIme (MAXIPIME) 2 g in sodium chloride 0.9 % 100 mL IVPB (2 g Intravenous New Bag/Given 05/10/17 1919)  0.9 %  sodium chloride infusion (not administered)  acetaminophen (TYLENOL) tablet 500 mg (not administered)  Cholecalciferol 1,000 Units (not administered)  cyclobenzaprine (FLEXERIL) tablet 5 mg (not administered)  donepezil (ARICEPT) tablet 10 mg (not administered)  loratadine (CLARITIN) tablet 10 mg (not administered)  memantine (NAMENDA) tablet 5 mg (not administered)  ondansetron (ZOFRAN-ODT) disintegrating tablet 4 mg (not administered)  risperiDONE (RISPERDAL) tablet 0.25 mg (not administered)  heparin injection 5,000 Units (not administered)  vancomycin (VANCOCIN) 500 mg in sodium chloride 0.9 % 100 mL IVPB (not administered)  metroNIDAZOLE (FLAGYL) IVPB 500 mg (not administered)  ceFEPIme (MAXIPIME) 1 g in sodium chloride 0.9 % 100 mL IVPB (not administered)  lidocaine (XYLOCAINE) 2 % injection 0-20 mL (not administered)  polyethylene glycol (MIRALAX / GLYCOLAX) packet 34 g (not administered)  bisacodyl (DULCOLAX) suppository 10 mg (not administered)  sodium chloride 0.9 % bolus 1,000 mL (0 mLs Intravenous Stopped 05/10/17 1606)  vancomycin (VANCOCIN) 1,250 mg in sodium chloride 0.9 % 250 mL IVPB (1,250 mg Intravenous New Bag/Given 05/10/17 1728)  sodium chloride 0.9 % bolus 500 mL (0 mLs Intravenous Stopped 05/10/17 1923)  iopamidol (ISOVUE-300) 61 % injection (100 mLs  Contrast Given 05/10/17 1641)    Mobility walks with device

## 2017-05-10 NOTE — ED Provider Notes (Addendum)
[facility]-EMERGENCY DEPT Provider Note   CSN: [id number] Arrival date & time: 05/10/17  1220     History   Chief Complaint Chief Complaint  Patient presents with  . Abscess    buttocks, sacral area    HPI Caroline Wallace is a 84 y.o. female.  HPI   She is an 84 year old female with a history of dementia, hypertension, CKD stage II, who presents the ED today to be evaluated for an abscess to her left buttock that has been present for about 1.5 weeks, but began to drain spontaneously today.  Home health nurse noted there was pus and blood in drainage.  Spoke with patient's family member today, Olegario Messier, who works in case management here at [employer], who states that patient has had known pressure ulcers to the buttocks that have been being treated by home health aide, however abscess noted to the left buttock appears to be new today.  States that patient is at her baseline mental status and has had no fevers documented at home.  States that patient otherwise has seemed healthy to her recently.  Patient lives at [facility]. Hx limited due to pts dementia.   Past Medical History:  Diagnosis Date  . Allergy   . Altered mental status 2017  . CKD (chronic kidney disease), stage II    GFR 60s  . Dementia 2017  . Diabetes mellitus without complication (HCC)   . Hypertension   . Osteoporosis   . Vitamin D deficiency     Patient Active Problem List   Diagnosis Date Noted  . Sepsis (HCC) 05/10/2017  . Sacral decubitus ulcer, stage III (HCC) 05/10/2017  . Abscess 05/10/2017  . Leucocytosis 05/10/2017  . Elevated lactic acid level 05/10/2017  . Elevated hemoglobin A1c 03/29/2016  . Sundowning 03/28/2016  . Nocturnal wandering 12/21/2015  . Vitamin D deficiency 12/03/2015  . CKD (chronic kidney disease) stage 3, GFR 30-59 ml/min (HCC)   . Essential hypertension   . Osteoporosis   . Alzheimer's dementia with behavioral disturbance 03/07/2015    History  reviewed. No pertinent surgical history.  OB History    No data available       Home Medications    Prior to Admission medications   Medication Sig Start Date End Date Taking? Authorizing Provider  acetaminophen (TYLENOL) 500 MG tablet Take 500 mg by mouth 2 (two) times daily.   Yes [provider]  Cholecalciferol (VITAMIN D-1000 MAX ST) 1000 units tablet Take 1,000 Units by mouth daily.   Yes [provider]  cyclobenzaprine (FLEXERIL) 5 MG tablet Take 1 tablet (5 mg total) by mouth 2 (two) times daily as needed for muscle spasms. 11/02/16  Yes Alvira Monday, MD  donepezil (ARICEPT) 10 MG tablet Take 10 mg by mouth at bedtime.   Yes [provider]  lisinopril (PRINIVIL,ZESTRIL) 30 MG tablet Take 1 tablet (30 mg total) by mouth daily. 03/28/16  Yes Kuneff, Renee A, DO  loratadine (CLARITIN) 10 MG tablet Take 1 tablet (10 mg total) by mouth daily. 11/25/15  Yes Kuneff, Renee A, DO  memantine (NAMENDA) 5 MG tablet Take 5 mg by mouth daily.   Yes [provider]  ondansetron (ZOFRAN-ODT) 4 MG disintegrating tablet Take 4 mg by mouth every 6 (six) hours as needed for nausea. 02/26/17  Yes [provider]  risperiDONE (RISPERDAL) 0.25 MG tablet Take 0.25 mg by mouth 2 (two) times daily.   Yes [provider]  trolamine salicylate (ASPERCREME)  10 % cream Apply 1 application topically 2 (two) times daily.   Yes [provider]    Family History Family History  Problem Relation Age of Onset  . Lung cancer Mother     Social History Social History   Tobacco Use  . Smoking status: Never Smoker  . Smokeless tobacco: Never Used  Substance Use Topics  . Alcohol use: No  . Drug use: No     Allergies   Apple and Penicillins   Review of Systems Review of Systems  Unable to perform ROS: Dementia  Constitutional: Negative for fever.  Respiratory: Negative for shortness of breath.   Cardiovascular: Negative for chest  pain.  Gastrointestinal: Negative for abdominal pain, constipation, diarrhea and vomiting.  Genitourinary: Positive for dysuria. Negative for flank pain.  Skin:       Abscess to buttock     Physical Exam Updated Vital Signs BP 115/60   Pulse 83   Temp 98.8 F (37.1 C) (Oral)   Resp 16   Ht 5\' 4"  (1.626 m)   Wt 70.3 kg (155 lb)   SpO2 100%   BMI 26.61 kg/m   Physical Exam  Constitutional: She appears well-developed and well-nourished. No distress.  Nontoxic appearing  HENT:  Head: Normocephalic and atraumatic.  Mouth/Throat: Oropharynx is clear and moist.  Eyes: Conjunctivae are normal. Pupils are equal, round, and reactive to light.  Neck: Neck supple.  Cardiovascular: Normal rate, regular rhythm, normal heart sounds and intact distal pulses.  No murmur heard. Pulmonary/Chest: Effort normal and breath sounds normal. No stridor. No respiratory distress. She has no wheezes.  Abdominal: Soft. Bowel sounds are normal. She exhibits no distension. There is no tenderness. There is no guarding.  Musculoskeletal: She exhibits no edema.  Neurological: She is alert.  Pleasantly demented. Oriented to self only (baseline per family)  Skin: Skin is warm and dry.  Pt has 4cmx3cm fluctuant abscess to left buttock that is actively draining seropurulent fluid and is ttp. surrouding induration. Also with pressure ulcer stage III to sacral area. Appears to have necrotic tissue underlying. Deep ulcer to left upper buttock with <0.5cm opening, not actively draining. Stage II pressure ulcer to upper buttock, no necrosis noted.  Psychiatric: She has a normal mood and affect.  Nursing note and vitals reviewed.         ED Treatments / Results  Labs (all labs ordered are listed, but only abnormal results are displayed) Labs Reviewed  CBC - Abnormal; Notable for the following components:      Result Value   WBC 21.6 (*)    RBC 3.20 (*)    Hemoglobin 9.9 (*)    HCT 30.2 (*)    All other  components within normal limits  BASIC METABOLIC PANEL - Abnormal; Notable for the following components:   Glucose, Bld 116 (*)    BUN 31 (*)    Creatinine, Ser 1.09 (*)    Calcium 8.7 (*)    GFR calc non Af Amer 45 (*)    GFR calc Af Amer 52 (*)    All other components within normal limits  I-STAT CG4 LACTIC ACID, ED - Abnormal; Notable for the following components:   Lactic Acid, Venous 2.38 (*)    All other components within normal limits  URINE CULTURE  CULTURE, BLOOD (ROUTINE X 2)  CULTURE, BLOOD (ROUTINE X 2)  URINALYSIS, ROUTINE W REFLEX MICROSCOPIC  LACTIC ACID, PLASMA  CBC  CREATININE, SERUM  CBC  COMPREHENSIVE METABOLIC  PANEL  MAGNESIUM  I-STAT CG4 LACTIC ACID, ED  I-STAT CG4 LACTIC ACID, ED  I-STAT CG4 LACTIC ACID, ED    EKG  EKG Interpretation None       Radiology Ct Abdomen Pelvis W Contrast  Result Date: 05/10/2017 CLINICAL DATA:  Sacral/buttock ulcers. EXAM: CT ABDOMEN AND PELVIS WITH CONTRAST TECHNIQUE: Multidetector CT imaging of the abdomen and pelvis was performed using the standard protocol following bolus administration of intravenous contrast. CONTRAST:  ISOVUE-300 IOPAMIDOL (ISOVUE-300) INJECTION 61% COMPARISON:  Abdominal radiographs 01/06/2016 FINDINGS: Lower chest: Motion artifact through the lung bases with mild bibasilar scarring and/or atelectasis. No pleural effusion. Three-vessel coronary artery atherosclerosis. Hepatobiliary: Slight exclusion of the right hepatic dome. No focal liver abnormality identified. Unremarkable gallbladder. No biliary dilatation. Pancreas: Unremarkable. Spleen: Unremarkable. Adrenals/Urinary Tract: Unremarkable adrenal glands. 4.5 cm left upper pole renal cyst with a few thin internal septations. No renal calculi or hydronephrosis. Decompressed bladder. Stomach/Bowel: Small sliding hiatal hernia. Large amount of stool in the rectum with a moderate amount of stool in the colon. Redundant sigmoid colon. No evidence of  bowel obstruction. Unremarkable appendix. Vascular/Lymphatic: Diffuse atherosclerosis and tortuosity of the abdominal aorta without aneurysm. Suspected at least moderate stenoses of the celiac and superior mesenteric artery origins. No enlarged lymph nodes. Reproductive: Thickened appearance of the endometrium. 4.3 x 2.5 cm homogeneously hyperattenuating or enhancing left adnexal mass, possibly arising from the left ovary. Grossly unremarkable right ovary. Other: No intraperitoneal free fluid.  No abdominal wall hernia. Musculoskeletal: Sacral decubitus ulcer to the right of midline contains gas and a small amount of fluid and extends to the posterior margin of the lower sacrum and coccyx without evidence of underlying osseous erosion. Additional gas extends more superiorly posterior to the sacrum without significant fluid component. There is also a 2.2 x 1.6 cm gas and fluid collection posterior to the tip of the coccyx which is contiguous with the above described ulcer more superiorly. Surrounding soft tissue thickening and inflammatory stranding are present. More inferiorly, there is a 3.6 x 2.2 cm rim enhancing subcutaneous fluid collection in the inferior left gluteal region (series 2, image 84). Mild diffuse lumbar disc degeneration is noted. IMPRESSION: 1. Sacral decubitus ulcer with gas and fluid-filled tracts extending to the posterior sacrum and coccyx. No osseous erosion to suggest osteomyelitis. 2. 3.6 cm subcutaneous abscess in the inferior left gluteal region. 3. Large volume rectal stool. 4. Abnormal thickened appearance of the endometrium with 4.3 cm left adnexal mass. Nonemergent pelvic ultrasound is recommended for further evaluation. 5. Minimally complex 4.5 cm left renal cyst. 6.  Aortic Atherosclerosis (ICD10-I70.0). Electronically Signed   By: Sebastian Ache M.D.   On: 05/10/2017 17:22    Procedures Procedures (including critical care time)  Medications Ordered in ED Medications    vancomycin (VANCOCIN) 1,250 mg in sodium chloride 0.9 % 250 mL IVPB (1,250 mg Intravenous New Bag/Given 05/10/17 1728)  sodium chloride 0.9 % injection (not administered)  sodium chloride 0.9 % bolus 500 mL (not administered)  metroNIDAZOLE (FLAGYL) IVPB 500 mg (not administered)  ceFEPIme (MAXIPIME) 2 g in sodium chloride 0.9 % 100 mL IVPB (not administered)  0.9 %  sodium chloride infusion (not administered)  acetaminophen (TYLENOL) tablet 500 mg (not administered)  Cholecalciferol 1,000 Units (not administered)  cyclobenzaprine (FLEXERIL) tablet 5 mg (not administered)  donepezil (ARICEPT) tablet 10 mg (not administered)  lisinopril (PRINIVIL,ZESTRIL) tablet 30 mg (not administered)  loratadine (CLARITIN) tablet 10 mg (not administered)  memantine (NAMENDA) tablet 5 mg (  not administered)  ondansetron (ZOFRAN-ODT) disintegrating tablet 4 mg (not administered)  risperiDONE (RISPERDAL) tablet 0.25 mg (not administered)  heparin injection 5,000 Units (not administered)  vancomycin (VANCOCIN) 500 mg in sodium chloride 0.9 % 100 mL IVPB (not administered)  metroNIDAZOLE (FLAGYL) IVPB 500 mg (not administered)  ceFEPIme (MAXIPIME) 1 g in sodium chloride 0.9 % 100 mL IVPB (not administered)  polyethylene glycol (MIRALAX / GLYCOLAX) packet 17 g (not administered)  sodium chloride 0.9 % bolus 1,000 mL (0 mLs Intravenous Stopped 05/10/17 1606)  sodium chloride 0.9 % bolus 500 mL (500 mLs Intravenous New Bag/Given 05/10/17 1738)  iopamidol (ISOVUE-300) 61 % injection (100 mLs  Contrast Given 05/10/17 1641)     Initial Impression / Assessment and Plan / ED Course  I have reviewed the triage vital signs and the nursing notes.  Pertinent labs & imaging results that were available during my care of the patient were reviewed by me and considered in my medical decision making (see chart for details).    Discussed pt presentation and exam findings with Dr. Effie ShyWentz, who personally evaluated patient and agrees  with current workup and likely plan for admission.   Final Clinical Impressions(s) / ED Diagnoses   Final diagnoses:  Decubitus ulcer of sacral region, stage 3 (HCC)  Left buttock abscess  Elevated lactic acid level  Leukocytosis, unspecified type   84 y/o female with stage III pressure ulcer and left gluteal abscess. VSS, afebrile. Nontoxic appearing.   Exam with stage 3 pressure ulcer with necrotic tissue overlying. Picture above. Also with actively draining left buttock abscess. Has leukocytosis to 21.6, as well as anemia to 9.9. Cr 1.09 which is improved from previous, also with elevation in BUN to 31, could be secondary to dehydration. Initial lactic acid elevated to 2.38. Will give fluids and recheck. CT abd pelvis with gas and fluid filled tracts extending to the posterior sacrum and coccyx as well as a subcutaneous abscess to left inferior gluteal region. Pt given fluids and vancomycin in the ED. Vital signs have remained stable and pt in NAD.   Hospital service was consulted for admission and Dr. Renford DillsAdhikari accepted for inpt admission.  Per his recommendation Zosyn and additional fluids were ordered.  Plan for general surgery consult as well.  [facility] [place], who will follow up and consult general surgery in regards to patient.  Patient currently admitted to hospital service at time of signout.  ED Discharge Orders    None       Karrie MeresCouture, Jackey Housey S, PA-C 05/10/17 [id number]    Kim Oki S, PA-C 05/10/17 1834    Mancel BaleWentz, Elliott, MD 05/11/17 1214

## 2017-05-11 ENCOUNTER — Encounter (HOSPITAL_COMMUNITY)
Admission: EM | Disposition: A | Discharge status: Skilled Nursing Facility | Payer: Self-pay | Source: Home / Self Care | Attending: Internal Medicine

## 2017-05-11 ENCOUNTER — Encounter (HOSPITAL_COMMUNITY): Payer: Self-pay | Admitting: Certified Registered Nurse Anesthetist

## 2017-05-11 ENCOUNTER — Inpatient Hospital Stay (HOSPITAL_COMMUNITY): Payer: Medicare PPO | Admitting: Certified Registered Nurse Anesthetist

## 2017-05-11 ENCOUNTER — Other Ambulatory Visit: Payer: Self-pay

## 2017-05-11 DIAGNOSIS — A419 Sepsis, unspecified organism: Principal | ICD-10-CM

## 2017-05-11 HISTORY — PX: IRRIGATION AND DEBRIDEMENT ABSCESS: SHX5252

## 2017-05-11 LAB — COMPREHENSIVE METABOLIC PANEL
ALK PHOS: 59 U/L (ref 38–126)
ALT: 18 U/L (ref 14–54)
ANION GAP: 10 (ref 5–15)
AST: 18 U/L (ref 15–41)
Albumin: 2.4 g/dL — ABNORMAL LOW (ref 3.5–5.0)
BUN: 20 mg/dL (ref 6–20)
CALCIUM: 8 mg/dL — AB (ref 8.9–10.3)
CHLORIDE: 111 mmol/L (ref 101–111)
CO2: 21 mmol/L — ABNORMAL LOW (ref 22–32)
CREATININE: 0.93 mg/dL (ref 0.44–1.00)
GFR, EST NON AFRICAN AMERICAN: 54 mL/min — AB (ref >60)
Glucose, Bld: 140 mg/dL — ABNORMAL HIGH (ref 65–99)
Potassium: 4.4 mmol/L (ref 3.5–5.1)
SODIUM: 142 mmol/L (ref 135–145)
Total Bilirubin: 0.4 mg/dL (ref 0.3–1.2)
Total Protein: 6.9 g/dL (ref 6.5–8.1)

## 2017-05-11 LAB — CBC
HCT: 28.7 % — ABNORMAL LOW (ref 36.0–46.0)
Hemoglobin: 9.5 g/dL — ABNORMAL LOW (ref 12.0–15.0)
MCH: 30.5 pg (ref 26.0–34.0)
MCHC: 33.1 g/dL (ref 30.0–36.0)
MCV: 92.3 fL (ref 78.0–100.0)
PLATELETS: 392 10*3/uL (ref 150–400)
RBC: 3.11 MIL/uL — AB (ref 3.87–5.11)
RDW: 13.7 % (ref 11.5–15.5)
WBC: 21.3 10*3/uL — ABNORMAL HIGH (ref 4.0–10.5)

## 2017-05-11 LAB — GLUCOSE, CAPILLARY
GLUCOSE-CAPILLARY: 133 mg/dL — AB (ref 65–99)
GLUCOSE-CAPILLARY: 103 mg/dL — AB (ref 65–99)
Glucose-Capillary: 139 mg/dL — ABNORMAL HIGH (ref 65–99)

## 2017-05-11 LAB — SURGICAL PCR SCREEN
MRSA, PCR: POSITIVE — AB
STAPHYLOCOCCUS AUREUS: POSITIVE — AB

## 2017-05-11 LAB — LACTIC ACID, PLASMA: Lactic Acid, Venous: 2 mmol/L (ref 0.5–1.9)

## 2017-05-11 LAB — CG4 I-STAT (LACTIC ACID): LACTIC ACID, VENOUS: 1.32 mmol/L (ref 0.5–1.9)

## 2017-05-11 LAB — MAGNESIUM: Magnesium: 2 mg/dL (ref 1.7–2.4)

## 2017-05-11 SURGERY — IRRIGATION AND DEBRIDEMENT ABSCESS
Anesthesia: General | Site: Buttocks

## 2017-05-11 MED ORDER — 0.9 % SODIUM CHLORIDE (POUR BTL) OPTIME
TOPICAL | Status: DC | PRN
  Administered 2017-05-11: 1000 mL

## 2017-05-11 MED ORDER — FENTANYL CITRATE (PF) 100 MCG/2ML IJ SOLN: 25.0000 ug | INTRAMUSCULAR | Status: DC | PRN

## 2017-05-11 MED ORDER — MUPIROCIN 2 % EX OINT
1.0000 "application " | TOPICAL_OINTMENT | Freq: Two times a day (BID) | CUTANEOUS | Status: AC
  Administered 2017-05-11 – 2017-05-16 (×10): 1 via NASAL
  Filled 2017-05-11: qty 22

## 2017-05-11 MED ORDER — PHENYLEPHRINE 40 MCG/ML (10ML) SYRINGE FOR IV PUSH (FOR BLOOD PRESSURE SUPPORT)
PREFILLED_SYRINGE | INTRAVENOUS | Status: DC | PRN
  Administered 2017-05-11: 120 ug via INTRAVENOUS
  Administered 2017-05-11 (×5): 80 ug via INTRAVENOUS

## 2017-05-11 MED ORDER — ONDANSETRON HCL 4 MG/2ML IJ SOLN: 4.0000 mg | Freq: Once | INTRAMUSCULAR | Status: DC | PRN

## 2017-05-11 MED ORDER — CLINDAMYCIN PHOSPHATE 900 MG/50ML IV SOLN
900.0000 mg | INTRAVENOUS | Status: AC
  Administered 2017-05-11: 900 mg via INTRAVENOUS
  Filled 2017-05-11: qty 50

## 2017-05-11 MED ORDER — PROPOFOL 10 MG/ML IV BOLUS
INTRAVENOUS | Status: DC | PRN
  Administered 2017-05-11: 80 mg via INTRAVENOUS

## 2017-05-11 MED ORDER — EPHEDRINE SULFATE-NACL 50-0.9 MG/10ML-% IV SOSY
PREFILLED_SYRINGE | INTRAVENOUS | Status: DC | PRN
  Administered 2017-05-11: 10 mg via INTRAVENOUS

## 2017-05-11 MED ORDER — ONDANSETRON HCL 4 MG/2ML IJ SOLN
INTRAMUSCULAR | Status: DC | PRN
  Administered 2017-05-11: 4 mg via INTRAVENOUS

## 2017-05-11 MED ORDER — LACTATED RINGERS IR SOLN
Status: DC | PRN
  Administered 2017-05-11: 2000 mL

## 2017-05-11 MED ORDER — OXYCODONE HCL 5 MG PO TABS: 5.0000 mg | ORAL_TABLET | Freq: Once | ORAL | Status: DC | PRN

## 2017-05-11 MED ORDER — ADULT MULTIVITAMIN W/MINERALS CH
1.0000 | ORAL_TABLET | Freq: Every day | ORAL | Status: DC
  Administered 2017-05-11 – 2017-05-16 (×6): 1 via ORAL
  Filled 2017-05-11 (×6): qty 1

## 2017-05-11 MED ORDER — ONDANSETRON HCL 4 MG/2ML IJ SOLN
INTRAMUSCULAR | Status: AC
  Filled 2017-05-11: qty 2

## 2017-05-11 MED ORDER — DEXTROSE 5 % IV SOLN
INTRAVENOUS | Status: DC | PRN
  Administered 2017-05-11: 40 ug/min via INTRAVENOUS

## 2017-05-11 MED ORDER — BOOST / RESOURCE BREEZE PO LIQD CUSTOM
1.0000 | Freq: Three times a day (TID) | ORAL | Status: DC
  Administered 2017-05-11 – 2017-05-16 (×15): 1 via ORAL

## 2017-05-11 MED ORDER — FENTANYL CITRATE (PF) 100 MCG/2ML IJ SOLN
INTRAMUSCULAR | Status: DC | PRN
  Administered 2017-05-11 (×2): 50 ug via INTRAVENOUS

## 2017-05-11 MED ORDER — SUGAMMADEX SODIUM 200 MG/2ML IV SOLN
INTRAVENOUS | Status: AC
  Filled 2017-05-11: qty 2

## 2017-05-11 MED ORDER — PHENYLEPHRINE HCL 10 MG/ML IJ SOLN
INTRAMUSCULAR | Status: AC
  Filled 2017-05-11: qty 1

## 2017-05-11 MED ORDER — GABAPENTIN 300 MG PO CAPS
300.0000 mg | ORAL_CAPSULE | ORAL | Status: AC
  Administered 2017-05-11: 300 mg via ORAL
  Filled 2017-05-11: qty 1

## 2017-05-11 MED ORDER — BUPIVACAINE-EPINEPHRINE (PF) 0.5% -1:200000 IJ SOLN
INTRAMUSCULAR | Status: AC
  Filled 2017-05-11: qty 30

## 2017-05-11 MED ORDER — LACTATED RINGERS IV SOLN
INTRAVENOUS | Status: DC
  Administered 2017-05-11 (×2): via INTRAVENOUS

## 2017-05-11 MED ORDER — ROCURONIUM BROMIDE 10 MG/ML (PF) SYRINGE
PREFILLED_SYRINGE | INTRAVENOUS | Status: DC | PRN
  Administered 2017-05-11: 30 mg via INTRAVENOUS

## 2017-05-11 MED ORDER — PHENYLEPHRINE 40 MCG/ML (10ML) SYRINGE FOR IV PUSH (FOR BLOOD PRESSURE SUPPORT)
PREFILLED_SYRINGE | INTRAVENOUS | Status: AC
  Filled 2017-05-11: qty 10

## 2017-05-11 MED ORDER — CHLORHEXIDINE GLUCONATE CLOTH 2 % EX PADS
6.0000 | MEDICATED_PAD | Freq: Every day | CUTANEOUS | Status: DC
  Administered 2017-05-13 – 2017-05-16 (×4): 6 via TOPICAL

## 2017-05-11 MED ORDER — ACETAMINOPHEN 500 MG PO TABS
1000.0000 mg | ORAL_TABLET | ORAL | Status: AC
  Administered 2017-05-11: 1000 mg via ORAL
  Filled 2017-05-11: qty 2

## 2017-05-11 MED ORDER — LIDOCAINE 2% (20 MG/ML) 5 ML SYRINGE
INTRAMUSCULAR | Status: AC
Start: 2017-05-11 — End: ?
  Filled 2017-05-11: qty 5

## 2017-05-11 MED ORDER — FENTANYL CITRATE (PF) 100 MCG/2ML IJ SOLN
INTRAMUSCULAR | Status: AC
  Filled 2017-05-11: qty 2

## 2017-05-11 MED ORDER — CHLORHEXIDINE GLUCONATE CLOTH 2 % EX PADS
6.0000 | MEDICATED_PAD | Freq: Once | CUTANEOUS | Status: AC
  Administered 2017-05-11: 6 via TOPICAL

## 2017-05-11 MED ORDER — GENTAMICIN SULFATE 40 MG/ML IJ SOLN
5.0000 mg/kg | INTRAVENOUS | Status: AC
  Administered 2017-05-11: 350 mg via INTRAVENOUS
  Filled 2017-05-11: qty 8.75

## 2017-05-11 MED ORDER — HEPARIN SODIUM (PORCINE) 5000 UNIT/ML IJ SOLN
5000.0000 [IU] | Freq: Three times a day (TID) | INTRAMUSCULAR | Status: DC
  Administered 2017-05-12 – 2017-05-16 (×11): 5000 [IU] via SUBCUTANEOUS
  Filled 2017-05-11 (×12): qty 1

## 2017-05-11 MED ORDER — SUGAMMADEX SODIUM 200 MG/2ML IV SOLN
INTRAVENOUS | Status: DC | PRN
  Administered 2017-05-11: 200 mg via INTRAVENOUS

## 2017-05-11 MED ORDER — OXYCODONE HCL 5 MG/5ML PO SOLN: 5.0000 mg | Freq: Once | ORAL | Status: DC | PRN

## 2017-05-11 MED ORDER — LIDOCAINE 2% (20 MG/ML) 5 ML SYRINGE
INTRAMUSCULAR | Status: DC | PRN
  Administered 2017-05-11: 60 mg via INTRAVENOUS

## 2017-05-11 SURGICAL SUPPLY — 20 items
BNDG GAUZE ELAST 4 BULKY (GAUZE/BANDAGES/DRESSINGS) ×3 IMPLANT
COVER SURGICAL LIGHT HANDLE (MISCELLANEOUS) ×3 IMPLANT
DRAIN PENROSE 18X1/2 LTX STRL (DRAIN) ×3 IMPLANT
GAUZE SPONGE 4X4 12PLY STRL (GAUZE/BANDAGES/DRESSINGS) ×3 IMPLANT
GLOVE BIO SURGEON STRL SZ 6 (GLOVE) ×3 IMPLANT
GLOVE BIOGEL PI IND STRL 6.5 (GLOVE) ×1 IMPLANT
GLOVE BIOGEL PI IND STRL 7.0 (GLOVE) ×1 IMPLANT
GLOVE BIOGEL PI INDICATOR 6.5 (GLOVE) ×2
GLOVE BIOGEL PI INDICATOR 7.0 (GLOVE) ×2
GLOVE INDICATOR 6.5 STRL GRN (GLOVE) ×3 IMPLANT
GOWN STRL REUS W/ TWL XL LVL3 (GOWN DISPOSABLE) ×3 IMPLANT
GOWN STRL REUS W/TWL 2XL LVL3 (GOWN DISPOSABLE) ×3 IMPLANT
GOWN STRL REUS W/TWL XL LVL3 (GOWN DISPOSABLE) ×6
HANDPIECE INTERPULSE COAX TIP (DISPOSABLE) ×2
PAD ABD 7.5X8 STRL (GAUZE/BANDAGES/DRESSINGS) ×3 IMPLANT
SET HNDPC FAN SPRY TIP SCT (DISPOSABLE) ×1 IMPLANT
SUT ETHILON 2 0 PS N (SUTURE) ×3 IMPLANT
SWAB COLLECTION DEVICE MRSA (MISCELLANEOUS) ×3 IMPLANT
TAPE CLOTH SURG 4X10 WHT LF (GAUZE/BANDAGES/DRESSINGS) ×3 IMPLANT
TOWEL OR 17X26 10 PK STRL BLUE (TOWEL DISPOSABLE) ×3 IMPLANT

## 2017-05-11 NOTE — Progress Notes (Signed)
PROGRESS NOTE    Caroline Wallace  ZOX:[id number] DOB: 12/21/1930 DOA: 05/10/2017 PCP: Natalia Leatherwood, DO   Brief Narrative: Caroline Wallace is a 84 y.o. female with medical history significant of advanced dementia, hypertension, CKD stage III, osteoporosis, nursing facility resident who was sent to the emergency department for the evaluation of worsening sacral ulcer and a new  draining abscess from the left buttock. She underwent Irrigation and Debridement sacral decubitus ulcer and drainage of gluteal abscess on 05/11/17.  She is currently on broad-spectrum antibiotics.   Assessment & Plan:   Principal Problem:   Sepsis (HCC) Active Problems:   CKD (chronic kidney disease) stage 3, GFR 30-59 ml/min (HCC)   Essential hypertension   Alzheimer's dementia with behavioral disturbance   Sacral decubitus ulcer, stage IV (HCC)   Abscess   Leucocytosis   Elevated lactic acid level   Constipation, chronic  Sepsis: Presented with elevated lactic acid level and white cell counts.  Will continue on broad-spectrum antibiotics with vancomycin, cefepime and Zosyn.  Patient is allergic to penicillin.  Blood pressure is currently stable. CT imaging was not suggestive of osteomyelitis. Cultures have been sent, will follow up  Stage III sacral decubitus ulcer/draining abscess of the left buttock: Ulcers looked infected, foul-smelling.   Underwent Irrigation and Debridement sacral decubitus ulcer and drainage of gluteal abscess  Lactic acidosis: We will continue to monitor lactic acid level.  Continue IV fluids  AKI on CKD Stage 3: Kidney function should be close to baseline.  Continue IV fluids  Constipation:Large volume rectal stool noted in CT on presentation. Started on daily miralax.  Abnormal finding on CT: Showed Abnormal thickened appearance of the endometrium with 4.3 cm left adnexal mass. Nonemergent pelvic ultrasound has been recommended for further evaluation radiology which can be  ordered later after discussion with family.  The importance of further exploration on this finding is questionable on this elderly female.  Advanced dementia: Patient is alert but confused.  Might be this is her baseline.  We will continue to monitor her mental status.  We will resume her home dementia meds.  Hypertension: Currently blood pressure stable.  On lisinopril at home which has been held.  We will continue to monitor her blood pressure.   DVT prophylaxis: heparin [place] Code Status: DNR Family Communication: With her duaghter  Disposition Plan: SNF/assisted living facility after resolution of the ulcer   Consultants: General surgery  Procedures: Incision and drainage, debridement on 05/11/17  Antimicrobials: Vancomycin, Flagyl, cefepime since 05/10/17  Subjective: Patient seen and examined the bedside this afternoon.  Remains comfortable.  She is alert and communicative.  Denies any complaints.  Objective: Vitals:   05/11/17 1200 05/11/17 1215 05/11/17 1226 05/11/17 1444  BP: 116/63 112/61 (!) 113/55 (!) 135/53  Pulse: 80 76 78 95  Resp: 18 (!) 21 16 16   Temp:  98.3 F (36.8 C) 98.1 F (36.7 C) 98.3 F (36.8 C)  TempSrc:    Oral  SpO2: 96% 98% 95% 100%  Weight:      Height:        Intake/Output Summary (Last 24 hours) at 05/11/2017 1500 Last data filed at 05/11/2017 1350 Gross per 24 hour  Intake 3710 ml  Output 300 ml  Net 3410 ml   Filed Weights   05/10/17 1606  Weight: 70.3 kg (155 lb)    Examination:  General exam: Appears calm and comfortable ,Not in distress,average built HEENT:PERRL,Oral mucosa moist, Ear/Nose normal on gross exam Respiratory system:  Bilateral equal air entry, normal vesicular breath sounds, no wheezes or crackles  Cardiovascular system: S1 & S2 heard, RRR. No JVD, murmurs, rubs, gallops or clicks. No pedal edema. Gastrointestinal system: Abdomen is nondistended, soft and nontender. No organomegaly or masses felt. Normal bowel sounds  heard. Central nervous system: Alert ,pleasantly demented. No focal neurological deficits. Extremities: No edema, no clubbing ,no cyanosis, distal peripheral pulses palpable. Skin: No rashes, lesions ,no icterus ,no pallor Stage III decubitus sacral  ulcer covered with dressings     Data Reviewed: I have personally reviewed following labs and imaging studies  CBC: Recent Labs  Lab 05/10/17 1434 05/11/17 0634  WBC 21.6* 21.3*  HGB 9.9* 9.5*  HCT 30.2* 28.7*  MCV 94.4 92.3  PLT 397 392   Basic Metabolic Panel: Recent Labs  Lab 05/10/17 1434 05/11/17 0634  NA 142 142  K 4.5 4.4  CL 108 111  CO2 24 21*  GLUCOSE 116* 140*  BUN 31* 20  CREATININE 1.09* 0.93  CALCIUM 8.7* 8.0*  MG  --  2.0   GFR: Estimated Creatinine Clearance: 42.5 mL/min (by C-G formula based on SCr of 0.93 mg/dL). Liver Function Tests: Recent Labs  Lab 05/11/17 0634  AST 18  ALT 18  ALKPHOS 59  BILITOT 0.4  PROT 6.9  ALBUMIN 2.4*   No results for input(s): LIPASE, AMYLASE in the last 168 hours. No results for input(s): AMMONIA in the last 168 hours. Coagulation Profile: No results for input(s): INR, PROTIME in the last 168 hours. Cardiac Enzymes: No results for input(s): CKTOTAL, CKMB, CKMBINDEX, TROPONINI in the last 168 hours. BNP (last 3 results) No results for input(s): PROBNP in the last 8760 hours. HbA1C: No results for input(s): HGBA1C in the last 72 hours. CBG: Recent Labs  Lab 05/11/17 0906 05/11/17 1138 05/11/17 1324  GLUCAP 133* 139* 103*   Lipid Profile: No results for input(s): CHOL, HDL, LDLCALC, TRIG, CHOLHDL, LDLDIRECT in the last 72 hours. Thyroid Function Tests: No results for input(s): TSH, T4TOTAL, FREET4, T3FREE, THYROIDAB in the last 72 hours. Anemia Panel: No results for input(s): VITAMINB12, FOLATE, FERRITIN, TIBC, IRON, RETICCTPCT in the last 72 hours. Sepsis Labs: Recent Labs  Lab 05/10/17 1621 05/10/17 1919 05/11/17 0634  LATICACIDVEN 2.38* 1.32  2.0*    No results found for this or any previous visit (from the past 240 hour(s)).       Radiology Studies: Ct Abdomen Pelvis W Contrast  Result Date: 05/10/2017 CLINICAL DATA:  Sacral/buttock ulcers. EXAM: CT ABDOMEN AND PELVIS WITH CONTRAST TECHNIQUE: Multidetector CT imaging of the abdomen and pelvis was performed using the standard protocol following bolus administration of intravenous contrast. CONTRAST:  ISOVUE-300 IOPAMIDOL (ISOVUE-300) INJECTION 61% COMPARISON:  Abdominal radiographs 01/06/2016 FINDINGS: Lower chest: Motion artifact through the lung bases with mild bibasilar scarring and/or atelectasis. No pleural effusion. Three-vessel coronary artery atherosclerosis. Hepatobiliary: Slight exclusion of the right hepatic dome. No focal liver abnormality identified. Unremarkable gallbladder. No biliary dilatation. Pancreas: Unremarkable. Spleen: Unremarkable. Adrenals/Urinary Tract: Unremarkable adrenal glands. 4.5 cm left upper pole renal cyst with a few thin internal septations. No renal calculi or hydronephrosis. Decompressed bladder. Stomach/Bowel: Small sliding hiatal hernia. Large amount of stool in the rectum with a moderate amount of stool in the colon. Redundant sigmoid colon. No evidence of bowel obstruction. Unremarkable appendix. Vascular/Lymphatic: Diffuse atherosclerosis and tortuosity of the abdominal aorta without aneurysm. Suspected at least moderate stenoses of the celiac and superior mesenteric artery origins. No enlarged lymph nodes. Reproductive: Thickened appearance  of the endometrium. 4.3 x 2.5 cm homogeneously hyperattenuating or enhancing left adnexal mass, possibly arising from the left ovary. Grossly unremarkable right ovary. Other: No intraperitoneal free fluid.  No abdominal wall hernia. Musculoskeletal: Sacral decubitus ulcer to the right of midline contains gas and a small amount of fluid and extends to the posterior margin of the lower sacrum and coccyx  without evidence of underlying osseous erosion. Additional gas extends more superiorly posterior to the sacrum without significant fluid component. There is also a 2.2 x 1.6 cm gas and fluid collection posterior to the tip of the coccyx which is contiguous with the above described ulcer more superiorly. Surrounding soft tissue thickening and inflammatory stranding are present. More inferiorly, there is a 3.6 x 2.2 cm rim enhancing subcutaneous fluid collection in the inferior left gluteal region (series 2, image 84). Mild diffuse lumbar disc degeneration is noted. IMPRESSION: 1. Sacral decubitus ulcer with gas and fluid-filled tracts extending to the posterior sacrum and coccyx. No osseous erosion to suggest osteomyelitis. 2. 3.6 cm subcutaneous abscess in the inferior left gluteal region. 3. Large volume rectal stool. 4. Abnormal thickened appearance of the endometrium with 4.3 cm left adnexal mass. Nonemergent pelvic ultrasound is recommended for further evaluation. 5. Minimally complex 4.5 cm left renal cyst. 6.  Aortic Atherosclerosis (ICD10-I70.0). Electronically Signed   By: Sebastian AcheAllen  Grady M.D.   On: 05/10/2017 17:22        Scheduled Meds: . acetaminophen  500 mg Oral BID  . bisacodyl  10 mg Rectal Daily  . cholecalciferol  1,000 Units Oral Daily  . donepezil  10 mg Oral QHS  . [START ON 05/12/2017] heparin  5,000 Units Subcutaneous Q8H  . loratadine  10 mg Oral Daily  . memantine  5 mg Oral Daily  . polyethylene glycol  34 g Oral BID  . risperiDONE  0.25 mg Oral BID   Continuous Infusions: . sodium chloride 100 mL/hr (05/10/17 2240)  . ceFEPime (MAXIPIME) IV    . metronidazole Stopped (05/11/17 0400)  . [START ON 05/12/2017] vancomycin       LOS: 1 day    Time spent: 25 mins     Malekai Markwood Salli QuarryAdhikari BK, MD [facility] Pager [phone]  If 7PM-7AM, please contact night-coverage [email or web address] Password [email or web address] 05/11/2017, 3:00 PM

## 2017-05-11 NOTE — Interval H&P Note (Signed)
History and Physical Interval Note:  05/11/2017 9:20 AM  Caroline Wallace AasJ Cornwall  has presented today for surgery, with the diagnosis of Decubutis and abcess  The various methods of treatment have been discussed with the patient and family. After consideration of risks, benefits and other options for treatment, the patient has consented to  Procedure(s): IRRIGATION AND DEBRIDEMENT SACRAL DECUBUTIS ULCER AND ABCESS (N/A) as a surgical intervention .  The patient's history has been reviewed, patient examined, no change in status, stable for surgery.  I have reviewed the patient's chart and labs.  Questions were answered to the patient's satisfaction.     [name]

## 2017-05-11 NOTE — Transfer of Care (Signed)
Immediate Anesthesia Transfer of Care Note  Patient: Caroline [place]  Procedure(s) Performed: IRRIGATION AND DEBRIDEMENT OF SACRAL DECUBUTIS ULCER  (N/A Buttocks)  Patient Location: PACU  Anesthesia Type:General  Level of Consciousness: drowsy and patient cooperative  Airway & Oxygen Therapy: Patient connected to face mask  Post-op Assessment: vss  Post vital signs: Reviewed and stable  Last Vitals:  Vitals:   05/11/17 0600 05/11/17 0848  BP: (!) 118/55 (!) 105/59  Pulse: 80 85  Resp: 16 16  Temp: 37.6 C 37.7 C  SpO2: 95% 94%    Last Pain:  Vitals:   05/11/17 0853  TempSrc:   PainSc: 0-No pain         Complications: No apparent anesthesia complications

## 2017-05-11 NOTE — Anesthesia Procedure Notes (Signed)
Procedure Name: Intubation Date/Time: 05/11/2017 10:09 AM Performed by: Vanessa Durhamochran, Shreyan Hinz Glenn, CRNA Pre-anesthesia Checklist: Patient identified, Emergency Drugs available, Suction available and Patient being monitored Patient Re-evaluated:Patient Re-evaluated prior to induction Oxygen Delivery Method: Circle system utilized Preoxygenation: Pre-oxygenation with 100% oxygen Induction Type: IV induction Ventilation: Mask ventilation without difficulty Laryngoscope Size: 2 and Miller Grade View: Grade I Tube type: Oral Tube size: 7.0 mm Number of attempts: 1 Airway Equipment and Method: Stylet Placement Confirmation: ETT inserted through vocal cords under direct vision,  positive ETCO2 and breath sounds checked- equal and bilateral Secured at: 22 cm Tube secured with: Tape Dental Injury: Teeth and Oropharynx as per pre-operative assessment

## 2017-05-11 NOTE — Consult Note (Signed)
WOC Nurse wound consult note Reason for Consult: Infection sacral pressure injury.  Evaluated by surgical services with surgical intervention planned. Will defer to surgical services at this time.  Please re-consult if WOC team needed for ongoing treatment/wound care.  Wound type:Pressure injury, sacrum and left ischial tuberosity region Pressure Injury POA: Yes Will not follow at this time.  Please re-consult if needed.  Maple HudsonKaren Kalaysia Demonbreun RN BSN CWON Pager [phone]

## 2017-05-11 NOTE — Op Note (Signed)
Irrigation and Debridement sacral decubitus ulcer and drainage of gluteal abscess  Pre-operative Diagnosis: sacral decubitus ulcer/abscess with gluteal abscess  Post-operative Diagnosis: same   Indications: Pt with SIRS and purulent draining wound on sacrum with liquefying skin.     Anesthesia: General   Procedure Details  The procedure, risks and complications have been discussed in detail (including, infection, bleeding, need for additional procedures) with the patient, and the patient has signed consent to the procedure. The patient was informed that the wound would be left open.  The patient was taken to OR 4 and general anesthesia was induced. The patient was placed into lateral position.  The skin was sterilely prepped and draped over the affected area in the usual fashion. Time out was performed according to the surgical safety checklist.   Immediately visible was liquefying necrotic skin over the sacrum.  This was debrided sharply with scissors.  Skin and soft tissue was taken. Cultures were sent. Additional skin was debrided around the opening to get back to bleeding tissue.  Subcutaneous fat and fascia was also debrided sharply.  A total of 6.5 x 2.5 x 4 cm of skin and soft tissue was excised.   Purulent drainage was present. The pulse lavage was used to irrigate the cavity. There was an additional area on the left buttock over the ischium that was draining.  An elliptical incision was made and the pus allowed to evacuate.  A penrose drain was placed through this opening into the larger opening.    There was tunneling superiorly in the midline and inferolaterally to the left.  The penrose was placed into the superior tunnel and secured to the upper skin with two 2-0 nylon interrupted sutures.  The penrose was also secured to the inferior left gluteal wound with two nylon sutures.    The wound was packed with betadine soaked kerlix.  The patient was awakened from anesthesia and taken to  the PACU in stable condition.   Findings:  Necrotic skin and soft tissue with tunneling as described above.    EBL: 25 cc's   Drains: None   Condition: Tolerated procedure well   Complications:  none known.

## 2017-05-11 NOTE — Progress Notes (Signed)
Initial Nutrition Assessment  DOCUMENTATION CODES:   Not applicable  INTERVENTION:    Obtain admission weight.   Boost Breeze po TID, each supplement provides 250 kcal and 9 grams of protein  Provide MVI daily  NUTRITION DIAGNOSIS:   Increased nutrient needs related to wound healing as evidenced by estimated needs.  GOAL:   Patient will meet greater than or equal to 90% of their needs   MONITOR:   PO intake, Supplement acceptance, Weight trends, Labs, Diet advancement  REASON FOR ASSESSMENT:   Consult Wound healing  ASSESSMENT:   Patient with PMH significant for advanced dementia, HTN, CKD III, and osteoporosis. Presents this admission with worsening sacral ulcer and a new  draining abscess from the left buttock.    Spoke with pt at bedside. Pt noted to be poor historian but she denies any recent loss in appetite. Reports the facility provides her with three meals per day and she typically eats two of them. She likes to eat only the vegetables and meat. She does not drink supplements. Pt currently on CLD consuming 50% of beef broth, italian ice, and cranberry juice. RD to order supplementation.   Pt endorses a UBW of 147 lb and unsure of any weight loss. Records are limited in actual weights. Would recommend checking weight this admission. Nutrition-Focused physical exam completed.  Medications reviewed and include: Vit D, IV abx Labs reviewed.   NUTRITION - FOCUSED PHYSICAL EXAM:    Most Recent Value  Orbital Region  No depletion  Upper Arm Region  No depletion  Thoracic and Lumbar Region  Unable to assess  Buccal Region  No depletion  Temple Region  No depletion  Clavicle Bone Region  No depletion  Clavicle and Acromion Bone Region  No depletion  Scapular Bone Region  Unable to assess  Dorsal Hand  No depletion  Patellar Region  No depletion  Anterior Thigh Region  No depletion  Posterior Calf Region  No depletion  Edema (RD Assessment)  Unable to assess      Diet Order:  Skin care precautions Diet clear liquid Room service appropriate? Yes; Fluid consistency: Thin  EDUCATION NEEDS:   Not appropriate for education at this time  Skin:  Skin Assessment: Skin Integrity Issues: Skin Integrity Issues:: Stage II, Stage III Stage II: buttocks Stage III: sacrum  Last BM:  05/10/17  Height:   Ht Readings from Last 1 Encounters:  05/10/17 5\' 4"  (1.626 m)    Weight:   Wt Readings from Last 1 Encounters:  05/10/17 155 lb (70.3 kg)    Ideal Body Weight:  54.5 kg  BMI:  Body mass index is 26.61 kg/m.  Estimated Nutritional Needs:   Kcal:  1600-1800 kcal/day  Protein:  80-90 g/day  Fluid:  >1.6 L/day    Vanessa Kickarly Emiliano Welshans RD, LDN Clinical Nutrition Pager # - [phone]

## 2017-05-11 NOTE — Progress Notes (Deleted)
CRITICAL VALUE ALERT  Critical Value:  + MRSA wound culture   Date & Time Notied:  05/11/2017 1644  Provider Notified: Renford DillsAdhikari   Orders Received/Actions taken:

## 2017-05-11 NOTE — Progress Notes (Signed)
Spoke with Judie PetitHCPOA Kathy, at present time pt is from [facility]. CSW will follow.

## 2017-05-11 NOTE — Anesthesia Postprocedure Evaluation (Signed)
Anesthesia Post Note  Patient: Caroline JohnsClara J [place]  Procedure(s) Performed: IRRIGATION AND DEBRIDEMENT OF SACRAL DECUBUTIS ULCER  (N/A Buttocks)     Patient location during evaluation: PACU Anesthesia Type: General Level of consciousness: awake and alert Pain management: pain level controlled Vital Signs Assessment: post-procedure vital signs reviewed and stable Respiratory status: spontaneous breathing, nonlabored ventilation and respiratory function stable Cardiovascular status: blood pressure returned to baseline and stable Postop Assessment: no apparent nausea or vomiting Anesthetic complications: no    Last Vitals:  Vitals:   05/11/17 1215 05/11/17 1226  BP: 112/61 (!) 113/55  Pulse: 76 78  Resp: (!) 21 16  Temp: 36.8 C 36.7 C  SpO2: 98% 95%    Last Pain:  Vitals:   05/11/17 1200  TempSrc:   PainSc: 0-No pain                 Beryle Lathehomas E Othal Kubitz

## 2017-05-11 NOTE — Progress Notes (Signed)
Critical Lactic Acid 2.0 MD Paged

## 2017-05-11 NOTE — Anesthesia Preprocedure Evaluation (Addendum)
Anesthesia Evaluation  Patient identified by MRN, date of birth, ID band Patient awake    Reviewed: Allergy & Precautions, NPO status , Patient's Chart, lab work & pertinent test results  Airway Mallampati: II   Neck ROM: Full    Dental  (+) Edentulous Upper, Edentulous Lower   Pulmonary neg pulmonary ROS,    Pulmonary exam normal breath sounds clear to auscultation       Cardiovascular hypertension, Pt. on medications Normal cardiovascular exam Rhythm:Regular Rate:Normal     Neuro/Psych Dementia negative neurological ROS     GI/Hepatic negative GI ROS, Neg liver ROS,   Endo/Other  diabetes  Renal/GU CRFRenal disease  negative genitourinary   Musculoskeletal negative musculoskeletal ROS (+)   Abdominal   Peds  Hematology  (+) anemia , Leukocytosis   Anesthesia Other Findings   Reproductive/Obstetrics                            Anesthesia Physical Anesthesia Plan  ASA: III  Anesthesia Plan: General   Post-op Pain Management:    Induction: Intravenous  PONV Risk Score and Plan: 3 and Treatment may vary due to age or medical condition, Ondansetron and Propofol infusion  Airway Management Planned: Oral ETT  Additional Equipment: None  Intra-op Plan:   Post-operative Plan: Extubation in OR  Informed Consent: I have reviewed the patients History and Physical, chart, labs and discussed the procedure including the risks, benefits and alternatives for the proposed anesthesia with the patient or authorized representative who has indicated his/her understanding and acceptance.   Dental advisory given  Plan Discussed with: CRNA  Anesthesia Plan Comments:         Anesthesia Quick Evaluation

## 2017-05-12 ENCOUNTER — Encounter (HOSPITAL_COMMUNITY): Payer: Self-pay | Admitting: General Surgery

## 2017-05-12 LAB — BASIC METABOLIC PANEL
Anion gap: 6 (ref 5–15)
BUN: 18 mg/dL (ref 6–20)
CHLORIDE: 111 mmol/L (ref 101–111)
CO2: 21 mmol/L — AB (ref 22–32)
CREATININE: 1.04 mg/dL — AB (ref 0.44–1.00)
Calcium: 7.5 mg/dL — ABNORMAL LOW (ref 8.9–10.3)
GFR calc Af Amer: 55 mL/min — ABNORMAL LOW (ref >60)
GFR calc non Af Amer: 48 mL/min — ABNORMAL LOW (ref >60)
Glucose, Bld: 131 mg/dL — ABNORMAL HIGH (ref 65–99)
POTASSIUM: 4.2 mmol/L (ref 3.5–5.1)
SODIUM: 138 mmol/L (ref 135–145)

## 2017-05-12 LAB — CBC WITH DIFFERENTIAL/PLATELET
BASOS ABS: 0 10*3/uL (ref 0.0–0.1)
BASOS PCT: 0 %
EOS PCT: 2 %
Eosinophils Absolute: 0.4 10*3/uL (ref 0.0–0.7)
HCT: 23.8 % — ABNORMAL LOW (ref 36.0–46.0)
Hemoglobin: 7.9 g/dL — ABNORMAL LOW (ref 12.0–15.0)
Lymphocytes Relative: 5 %
Lymphs Abs: 1.2 10*3/uL (ref 0.7–4.0)
MCH: 30.5 pg (ref 26.0–34.0)
MCHC: 33.2 g/dL (ref 30.0–36.0)
MCV: 91.9 fL (ref 78.0–100.0)
MONO ABS: 1 10*3/uL (ref 0.1–1.0)
Monocytes Relative: 5 %
Neutro Abs: 18.7 10*3/uL — ABNORMAL HIGH (ref 1.7–7.7)
Neutrophils Relative %: 88 %
PLATELETS: 363 10*3/uL (ref 150–400)
RBC: 2.59 MIL/uL — ABNORMAL LOW (ref 3.87–5.11)
RDW: 13.8 % (ref 11.5–15.5)
WBC: 21.3 10*3/uL — AB (ref 4.0–10.5)

## 2017-05-12 NOTE — Progress Notes (Signed)
PROGRESS NOTE    Caroline [place]  AOZ:[id number] DOB: 1930/04/13 DOA: 05/10/2017 PCP: Natalia Leatherwood, DO   Brief Narrative: Caroline [place] is a 84 y.o. female with medical history significant of advanced dementia, hypertension, CKD stage III, osteoporosis, nursing facility resident who was sent to the emergency department for the evaluation of worsening sacral ulcer and a new  draining abscess from the left buttock. She underwent Irrigation and Debridement sacral decubitus ulcer and drainage of gluteal abscess on 05/11/17.  She is currently on broad-spectrum antibiotics.   Assessment & Plan:   Principal Problem:   Sepsis (HCC) Active Problems:   CKD (chronic kidney disease) stage 3, GFR 30-59 ml/min (HCC)   Essential hypertension   Alzheimer's dementia with behavioral disturbance   Sacral decubitus ulcer, stage IV (HCC)   Abscess   Leucocytosis   Elevated lactic acid level   Constipation, chronic  Sepsis: Presented with elevated lactic acid level and white cell counts.  Will continue on broad-spectrum antibiotics with vancomycin, cefepime and Zosyn.  Patient is allergic to penicillin.  Blood pressure is currently stable. CT imaging was not suggestive of osteomyelitis. Cultures have been sent, will follow up.  At a week wound culture growing mixed organisms  Stage III sacral decubitus ulcer/draining abscess of the left buttock: Ulcers looked infected, foul-smelling on presentation.  Underwent Irrigation and Debridement sacral decubitus ulcer and drainage of gluteal abscess  Lactic acidosis: We will continue to monitor lactic acid level.  Continue IV fluids  AKI on CKD Stage 3: Kidney function should be close to baseline.  Continue IV fluids  Constipation:Large volume rectal stool noted in CT on presentation. Started on daily miralax.  Abnormal finding on CT: Showed Abnormal thickened appearance of the endometrium with 4.3 cm left adnexal mass. Nonemergent pelvic ultrasound has  been recommended for further evaluation radiology which can be ordered later after discussion with family.  The importance of further exploration on this finding is questionable on this elderly female.  Advanced dementia: Patient is alert and awakw but confused.  Might be this is her baseline.  We will continue to monitor her mental status.  We will resume her home dementia meds.  Hypertension: Currently blood pressure stable.  On lisinopril at home which has been held.  We will continue to monitor her blood pressure.  Deconditioning/debility: We will order for physical therapy evaluation  DVT prophylaxis: heparin Wye Code Status: DNR Family Communication: None present at the bedside today Disposition Plan: SNF/assisted living facility after resolution of the ulcer   Consultants: General surgery  Procedures: Incision and drainage, debridement on 05/11/17  Antimicrobials: Vancomycin, Flagyl, cefepime since 05/10/17  Subjective: Patient seen and examined the bedside this morning.  Remains comfortable.  No new issues/events  Objective: Vitals:   05/11/17 2229 05/12/17 0145 05/12/17 0612 05/12/17 0800  BP:  (!) 74/51 (!) 95/47 (!) 107/52  Pulse:  81 82 78  Resp:  20 19 18   Temp: (!) 100.9 F (38.3 C) 99.2 F (37.3 C) 99.3 F (37.4 C) 99.2 F (37.3 C)  TempSrc: Oral Oral Oral Oral  SpO2:  98% 100% 100%  Weight:      Height:        Intake/Output Summary (Last 24 hours) at 05/12/2017 1305 Last data filed at 05/12/2017 0615 Gross per 24 hour  Intake 2193.33 ml  Output 200 ml  Net 1993.33 ml   Filed Weights   05/10/17 1606  Weight: 70.3 kg (155 lb)    Examination:  General exam: Appears calm and comfortable ,Not in distress,average built, elderly female HEENT:PERRL,Oral mucosa moist, Ear/Nose normal on gross exam Respiratory system: Bilateral equal air entry, normal vesicular breath sounds, no wheezes or crackles  Cardiovascular system: S1 & S2 heard, RRR. No JVD, murmurs,  rubs, gallops or clicks. Gastrointestinal system: Abdomen is nondistended, soft and nontender. No organomegaly or masses felt. Normal bowel sounds heard. Central nervous system: Alert and oriented. No focal neurological deficits. Extremities: No edema, no clubbing ,no cyanosis, distal peripheral pulses palpable. Skin: No rashes, lesions or ulcers,no icterus ,no pallor Stage III decubitus sacral  ulcer covered with dressings     Data Reviewed: I have personally reviewed following labs and imaging studies  CBC: Recent Labs  Lab 05/10/17 1434 05/11/17 0634 05/12/17 0552  WBC 21.6* 21.3* 21.3*  NEUTROABS  --   --  18.7*  HGB 9.9* 9.5* 7.9*  HCT 30.2* 28.7* 23.8*  MCV 94.4 92.3 91.9  PLT 397 392 363   Basic Metabolic Panel: Recent Labs  Lab 05/10/17 1434 05/11/17 0634 05/12/17 0552  NA 142 142 138  K 4.5 4.4 4.2  CL 108 111 111  CO2 24 21* 21*  GLUCOSE 116* 140* 131*  BUN 31* 20 18  CREATININE 1.09* 0.93 1.04*  CALCIUM 8.7* 8.0* 7.5*  MG  --  2.0  --    GFR: Estimated Creatinine Clearance: 38 mL/min (A) (by C-G formula based on SCr of 1.04 mg/dL (H)). Liver Function Tests: Recent Labs  Lab 05/11/17 0634  AST 18  ALT 18  ALKPHOS 59  BILITOT 0.4  PROT 6.9  ALBUMIN 2.4*   No results for input(s): LIPASE, AMYLASE in the last 168 hours. No results for input(s): AMMONIA in the last 168 hours. Coagulation Profile: No results for input(s): INR, PROTIME in the last 168 hours. Cardiac Enzymes: No results for input(s): CKTOTAL, CKMB, CKMBINDEX, TROPONINI in the last 168 hours. BNP (last 3 results) No results for input(s): PROBNP in the last 8760 hours. HbA1C: No results for input(s): HGBA1C in the last 72 hours. CBG: Recent Labs  Lab 05/11/17 0906 05/11/17 1138 05/11/17 1324  GLUCAP 133* 139* 103*   Lipid Profile: No results for input(s): CHOL, HDL, LDLCALC, TRIG, CHOLHDL, LDLDIRECT in the last 72 hours. Thyroid Function Tests: No results for input(s): TSH,  T4TOTAL, FREET4, T3FREE, THYROIDAB in the last 72 hours. Anemia Panel: No results for input(s): VITAMINB12, FOLATE, FERRITIN, TIBC, IRON, RETICCTPCT in the last 72 hours. Sepsis Labs: Recent Labs  Lab 05/10/17 1621 05/10/17 1919 05/11/17 0634  LATICACIDVEN 2.38* 1.32 2.0*    Recent Results (from the past 240 hour(s))  Blood culture (routine x 2)     Status: None (Preliminary result)   Collection Time: 05/10/17  4:16 PM  Result Value Ref Range Status   Specimen Description   Final    BLOOD LEFT ANTECUBITAL Performed at [facility], [address] [place], [place] [address]    Special Requests   Final    BOTTLES DRAWN AEROBIC AND ANAEROBIC Blood Culture adequate volume Performed at [facility], [address] [place], [place] [address]    Culture   Final    NO GROWTH < 24 HOURS Performed at [facility], [address] [place], [place] [address]    Report Status PENDING  Incomplete  Blood culture (routine x 2)     Status: None (Preliminary result)   Collection Time: 05/10/17  4:30 PM  Result Value Ref Range  Status   Specimen Description   Final    BLOOD RIGHT ANTECUBITAL Performed at [facility], [address] [place], [place] [address]    Special Requests   Final    BOTTLES DRAWN AEROBIC AND ANAEROBIC Blood Culture adequate volume Performed at [facility], [address] [place], [place] [address]    Culture   Final    NO GROWTH < 24 HOURS Performed at [facility], [address] [place], [place] [address]    Report Status PENDING  Incomplete  Surgical PCR screen     Status: Abnormal   Collection Time: 05/11/17  5:59 AM  Result Value Ref Range Status   MRSA, PCR POSITIVE (A) NEGATIVE Final    Comment: RESULT CALLED TO, READ BACK BY AND VERIFIED WITH: CAUDLE,E @ 1642 ON [id number] BY POTEAT,S    Staphylococcus aureus POSITIVE (A) NEGATIVE Final    Comment: (NOTE) The  Xpert SA Assay (FDA approved for NASAL specimens in patients 45 years of age and older), is one component of a comprehensive surveillance program. It is not intended to diagnose infection nor to guide or monitor treatment. Performed at [facility], [address] [place], [place] [address]   Anaerobic culture     Status: None (Preliminary result)   Collection Time: 05/11/17 11:09 AM  Result Value Ref Range Status   Specimen Description WOUND SACRAL DECUBITUS ULCER  Final   Special Requests   Final    PATIENT ON FOLLOWING CLINDAM/GENT Performed at [facility], [address] [place], [place] [address]    Culture PENDING  Incomplete   Report Status PENDING  Incomplete  Aerobic Culture (superficial specimen)     Status: None (Preliminary result)   Collection Time: 05/11/17 11:09 AM  Result Value Ref Range Status   Specimen Description   Final    WOUND SACRAL DECUB ULCER Performed at [facility], [address], [place], [place] [address]    Special Requests   Final    PATIENT ON FOLLOWING CLIND/GENT Performed at [facility], [address], [place], [place] [address]    Gram Stain   Final    RARE WBC PRESENT, PREDOMINANTLY MONONUCLEAR ABUNDANT GRAM NEGATIVE RODS ABUNDANT GRAM POSITIVE RODS ABUNDANT GRAM POSITIVE COCCI IN PAIRS AND CHAINS IN CLUSTERS    Culture   Final    CULTURE REINCUBATED FOR BETTER GROWTH Performed at [facility], [address] [place], [place] [address]    Report Status PENDING  Incomplete         Radiology Studies: Ct Abdomen Pelvis W Contrast  Result Date: 05/10/2017 CLINICAL DATA:  Sacral/buttock ulcers. EXAM: CT ABDOMEN AND PELVIS WITH CONTRAST TECHNIQUE: Multidetector CT imaging of the abdomen and pelvis was performed using the standard protocol following bolus administration of intravenous contrast. CONTRAST:  ISOVUE-300 IOPAMIDOL (ISOVUE-300)  INJECTION 61% COMPARISON:  Abdominal radiographs 01/06/2016 FINDINGS: Lower chest: Motion artifact through the lung bases with mild bibasilar scarring and/or atelectasis. No pleural effusion. Three-vessel coronary artery atherosclerosis. Hepatobiliary: Slight exclusion of the right hepatic dome. No focal liver abnormality identified. Unremarkable gallbladder. No biliary dilatation. Pancreas: Unremarkable. Spleen: Unremarkable. Adrenals/Urinary Tract: Unremarkable adrenal glands. 4.5 cm left upper pole renal cyst with a few thin internal septations. No renal calculi or hydronephrosis. Decompressed bladder. Stomach/Bowel: Small sliding hiatal hernia. Large amount of stool in the rectum with a moderate amount of stool in the colon. Redundant sigmoid colon.  No evidence of bowel obstruction. Unremarkable appendix. Vascular/Lymphatic: Diffuse atherosclerosis and tortuosity of the abdominal aorta without aneurysm. Suspected at least moderate stenoses of the celiac and superior mesenteric artery origins. No enlarged lymph nodes. Reproductive: Thickened appearance of the endometrium. 4.3 x 2.5 cm homogeneously hyperattenuating or enhancing left adnexal mass, possibly arising from the left ovary. Grossly unremarkable right ovary. Other: No intraperitoneal free fluid.  No abdominal wall hernia. Musculoskeletal: Sacral decubitus ulcer to the right of midline contains gas and a small amount of fluid and extends to the posterior margin of the lower sacrum and coccyx without evidence of underlying osseous erosion. Additional gas extends more superiorly posterior to the sacrum without significant fluid component. There is also a 2.2 x 1.6 cm gas and fluid collection posterior to the tip of the coccyx which is contiguous with the above described ulcer more superiorly. Surrounding soft tissue thickening and inflammatory stranding are present. More inferiorly, there is a 3.6 x 2.2 cm rim enhancing subcutaneous fluid collection in the  inferior left gluteal region (series 2, image 84). Mild diffuse lumbar disc degeneration is noted. IMPRESSION: 1. Sacral decubitus ulcer with gas and fluid-filled tracts extending to the posterior sacrum and coccyx. No osseous erosion to suggest osteomyelitis. 2. 3.6 cm subcutaneous abscess in the inferior left gluteal region. 3. Large volume rectal stool. 4. Abnormal thickened appearance of the endometrium with 4.3 cm left adnexal mass. Nonemergent pelvic ultrasound is recommended for further evaluation. 5. Minimally complex 4.5 cm left renal cyst. 6.  Aortic Atherosclerosis (ICD10-I70.0). Electronically Signed   By: Sebastian Ache M.D.   On: 05/10/2017 17:22        Scheduled Meds: . acetaminophen  500 mg Oral BID  . bisacodyl  10 mg Rectal Daily  . Chlorhexidine Gluconate Cloth  6 each Topical Q0600  . cholecalciferol  1,000 Units Oral Daily  . donepezil  10 mg Oral QHS  . feeding supplement  1 Container Oral TID BM  . heparin  5,000 Units Subcutaneous Q8H  . loratadine  10 mg Oral Daily  . memantine  5 mg Oral Daily  . multivitamin with minerals  1 tablet Oral Daily  . mupirocin ointment  1 application Nasal BID  . polyethylene glycol  34 g Oral BID  . risperiDONE  0.25 mg Oral BID   Continuous Infusions: . sodium chloride 100 mL/hr at 05/12/17 0439  . ceFEPime (MAXIPIME) IV Stopped (05/11/17 2101)  . metronidazole 500 mg (05/12/17 1109)  . vancomycin       LOS: 2 days    Time spent: 25 mins     Carmel Waddington Salli Quarry, MD [facility] Pager [phone]  If 7PM-7AM, please contact night-coverage [email or web address] Password [email or web address] 05/12/2017, 1:05 PM

## 2017-05-12 NOTE — Progress Notes (Addendum)
1 Day Post-Op I&D Subjective: Sleeping comfortably   Objective: Vital signs in last 24 hours: Temp:  [98.1 F (36.7 C)-101.6 F (38.7 C)] 99.2 F (37.3 C) (03/09 0800) Pulse Rate:  [76-95] 78 (03/09 0800) Resp:  [12-21] 18 (03/09 0800) BP: (74-135)/(47-68) 107/52 (03/09 0800) SpO2:  [95 %-100 %] 100 % (03/09 0800)   Intake/Output from previous day: 03/08 0701 - 03/09 0700 In: 3193.3 [P.O.:360; I.V.:2733.3; IV Piggyback:100] Out: 200 [Urine:200] Intake/Output this shift: No intake/output data recorded.   General appearance: appears stated age GI: normal findings: soft, non-tender  Incision: dressing itact  Lab Results:  Recent Labs    05/11/17 0634 05/12/17 0552  WBC 21.3* 21.3*  HGB 9.5* 7.9*  HCT 28.7* 23.8*  PLT 392 363   BMET Recent Labs    05/11/17 0634 05/12/17 0552  NA 142 138  K 4.4 4.2  CL 111 111  CO2 21* 21*  GLUCOSE 140* 131*  BUN 20 18  CREATININE 0.93 1.04*  CALCIUM 8.0* 7.5*   PT/INR No results for input(s): LABPROT, INR in the last 72 hours. ABG No results for input(s): PHART, HCO3 in the last 72 hours.  Invalid input(s): PCO2, PO2  MEDS, Scheduled . acetaminophen  500 mg Oral BID  . bisacodyl  10 mg Rectal Daily  . Chlorhexidine Gluconate Cloth  6 each Topical Q0600  . cholecalciferol  1,000 Units Oral Daily  . donepezil  10 mg Oral QHS  . feeding supplement  1 Container Oral TID BM  . heparin  5,000 Units Subcutaneous Q8H  . loratadine  10 mg Oral Daily  . memantine  5 mg Oral Daily  . multivitamin with minerals  1 tablet Oral Daily  . mupirocin ointment  1 application Nasal BID  . polyethylene glycol  34 g Oral BID  . risperiDONE  0.25 mg Oral BID    Studies/Results: Ct Abdomen Pelvis W Contrast  Result Date: 05/10/2017 CLINICAL DATA:  Sacral/buttock ulcers. EXAM: CT ABDOMEN AND PELVIS WITH CONTRAST TECHNIQUE: Multidetector CT imaging of the abdomen and pelvis was performed using the standard protocol following bolus  administration of intravenous contrast. CONTRAST:  ISOVUE-300 IOPAMIDOL (ISOVUE-300) INJECTION 61% COMPARISON:  Abdominal radiographs 01/06/2016 FINDINGS: Lower chest: Motion artifact through the lung bases with mild bibasilar scarring and/or atelectasis. No pleural effusion. Three-vessel coronary artery atherosclerosis. Hepatobiliary: Slight exclusion of the right hepatic dome. No focal liver abnormality identified. Unremarkable gallbladder. No biliary dilatation. Pancreas: Unremarkable. Spleen: Unremarkable. Adrenals/Urinary Tract: Unremarkable adrenal glands. 4.5 cm left upper pole renal cyst with a few thin internal septations. No renal calculi or hydronephrosis. Decompressed bladder. Stomach/Bowel: Small sliding hiatal hernia. Large amount of stool in the rectum with a moderate amount of stool in the colon. Redundant sigmoid colon. No evidence of bowel obstruction. Unremarkable appendix. Vascular/Lymphatic: Diffuse atherosclerosis and tortuosity of the abdominal aorta without aneurysm. Suspected at least moderate stenoses of the celiac and superior mesenteric artery origins. No enlarged lymph nodes. Reproductive: Thickened appearance of the endometrium. 4.3 x 2.5 cm homogeneously hyperattenuating or enhancing left adnexal mass, possibly arising from the left ovary. Grossly unremarkable right ovary. Other: No intraperitoneal free fluid.  No abdominal wall hernia. Musculoskeletal: Sacral decubitus ulcer to the right of midline contains gas and a small amount of fluid and extends to the posterior margin of the lower sacrum and coccyx without evidence of underlying osseous erosion. Additional gas extends more superiorly posterior to the sacrum without significant fluid component. There is also a 2.2 x 1.6 cm  gas and fluid collection posterior to the tip of the coccyx which is contiguous with the above described ulcer more superiorly. Surrounding soft tissue thickening and inflammatory stranding are present.  More inferiorly, there is a 3.6 x 2.2 cm rim enhancing subcutaneous fluid collection in the inferior left gluteal region (series 2, image 84). Mild diffuse lumbar disc degeneration is noted. IMPRESSION: 1. Sacral decubitus ulcer with gas and fluid-filled tracts extending to the posterior sacrum and coccyx. No osseous erosion to suggest osteomyelitis. 2. 3.6 cm subcutaneous abscess in the inferior left gluteal region. 3. Large volume rectal stool. 4. Abnormal thickened appearance of the endometrium with 4.3 cm left adnexal mass. Nonemergent pelvic ultrasound is recommended for further evaluation. 5. Minimally complex 4.5 cm left renal cyst. 6.  Aortic Atherosclerosis (ICD10-I70.0). Electronically Signed   By: Sebastian AcheAllen  Grady M.D.   On: 05/10/2017 17:22    Assessment: s/p Procedure(s): IRRIGATION AND DEBRIDEMENT OF SACRAL DECUBUTIS ULCER  Patient Active Problem List   Diagnosis Date Noted  . Sepsis (HCC) 05/10/2017  . Sacral decubitus ulcer, stage IV (HCC) 05/10/2017  . Abscess 05/10/2017  . Leucocytosis 05/10/2017  . Elevated lactic acid level 05/10/2017  . Constipation, chronic 05/10/2017  . Elevated hemoglobin A1c 03/29/2016  . Sundowning 03/28/2016  . Nocturnal wandering 12/21/2015  . Vitamin D deficiency 12/03/2015  . CKD (chronic kidney disease) stage 3, GFR 30-59 ml/min (HCC)   . Essential hypertension   . Osteoporosis   . Alzheimer's dementia with behavioral disturbance 03/07/2015      Plan: change packing with next dressing change  Wound RN consult   LOS: 2 days     .Vanita PandaAlicia C Ireland Chagnon, MD [facility], [place] [phone]   05/12/2017 10:12 AM

## 2017-05-13 DIAGNOSIS — L89153 Pressure ulcer of sacral region, stage 3: Secondary | ICD-10-CM

## 2017-05-13 LAB — BASIC METABOLIC PANEL
ANION GAP: 5 (ref 5–15)
BUN: 19 mg/dL (ref 6–20)
CO2: 23 mmol/L (ref 22–32)
Calcium: 7.5 mg/dL — ABNORMAL LOW (ref 8.9–10.3)
Chloride: 110 mmol/L (ref 101–111)
Creatinine, Ser: 1.01 mg/dL — ABNORMAL HIGH (ref 0.44–1.00)
GFR calc Af Amer: 57 mL/min — ABNORMAL LOW (ref >60)
GFR, EST NON AFRICAN AMERICAN: 49 mL/min — AB (ref >60)
GLUCOSE: 126 mg/dL — AB (ref 65–99)
POTASSIUM: 4.2 mmol/L (ref 3.5–5.1)
Sodium: 138 mmol/L (ref 135–145)

## 2017-05-13 LAB — CBC WITH DIFFERENTIAL/PLATELET
Basophils Absolute: 0 10*3/uL (ref 0.0–0.1)
Basophils Relative: 0 %
EOS PCT: 2 %
Eosinophils Absolute: 0.5 10*3/uL (ref 0.0–0.7)
HEMATOCRIT: 24.3 % — AB (ref 36.0–46.0)
Hemoglobin: 8 g/dL — ABNORMAL LOW (ref 12.0–15.0)
LYMPHS ABS: 1.2 10*3/uL (ref 0.7–4.0)
LYMPHS PCT: 5 %
MCH: 30.7 pg (ref 26.0–34.0)
MCHC: 32.9 g/dL (ref 30.0–36.0)
MCV: 93.1 fL (ref 78.0–100.0)
MONO ABS: 1 10*3/uL (ref 0.1–1.0)
Monocytes Relative: 4 %
NEUTROS ABS: 19.2 10*3/uL — AB (ref 1.7–7.7)
Neutrophils Relative %: 89 %
PLATELETS: 399 10*3/uL (ref 150–400)
RBC: 2.61 MIL/uL — AB (ref 3.87–5.11)
RDW: 14 % (ref 11.5–15.5)
WBC: 21.9 10*3/uL — ABNORMAL HIGH (ref 4.0–10.5)

## 2017-05-13 LAB — LACTIC ACID, PLASMA: Lactic Acid, Venous: 0.8 mmol/L (ref 0.5–1.9)

## 2017-05-13 MED ORDER — SODIUM CHLORIDE 0.9 % IV SOLN
2.0000 g | Freq: Two times a day (BID) | INTRAVENOUS | Status: DC
  Administered 2017-05-13 – 2017-05-16 (×6): 2 g via INTRAVENOUS
  Filled 2017-05-13 (×7): qty 2

## 2017-05-13 NOTE — Progress Notes (Addendum)
Pharmacy Antibiotic Note  Caroline JohnsClara J Wallace is a 84 y.o. female admitted on 05/10/2017 with left buttock abscess and worsening sacral ulcer. Pt underwent I&D on 3/8  Pharmacy has been consulted for vancomycin, cefepime and flagyl dosing.  05/13/2017 WBC 21.9 Scr 1.01, CrCl ~ 39.502mls/min  T 99.5 LA 0.8 Wound Cx: EColi ESBL, R-cephalosprins  Plan: -per d/w MD start merrem 2gm IV q12h, Stop cefepime and flagyl -Continue vancomycin 1250 mg IV q48 for est AUC 435 using SCr 1.09 and Wt 70.3 kg (IBW/TBW).  F/u renal fxn, wbc, temp, culture data Vancomycin levels as needed  Height: 5\' 4"  (162.6 cm) Weight: 155 lb (70.3 kg) IBW/kg (Calculated) : 54.7  Temp (24hrs), Avg:99.5 F (37.5 C), Min:99.5 F (37.5 C), Max:99.5 F (37.5 C)  Recent Labs  Lab 05/10/17 1434 05/10/17 1621 05/10/17 1919 05/11/17 0634 05/12/17 0552 05/13/17 0602  WBC 21.6*  --   --  21.3* 21.3* 21.9*  CREATININE 1.09*  --   --  0.93 1.04* 1.01*  LATICACIDVEN  --  2.38* 1.32 2.0*  --  0.8    Estimated Creatinine Clearance: 39.2 mL/min (A) (by C-G formula based on SCr of 1.01 mg/dL (H)).    Allergies  Allergen Reactions  . Apple   . Penicillins Hives   Antimicrobials this admission: 3/7 Vanc >>  3/7 Cefepime >>  3/7 Flagyl >>  Dose adjustments this admission:  Microbiology results: 3/7 BCx: ngtd 3/8 MRSA PCR: positive  3/8 wound I&D: GNR, GPR, GPC in pairs and chains in clusters  Thank you for allowing pharmacy to be a part of this patient's care.  Arley PhenixEllen Romaine Maciolek RPh 05/13/2017, 9:30 AM Pager [phone]

## 2017-05-13 NOTE — Progress Notes (Signed)
2 Days Post-Op I&D Subjective: Sleeping comfortably, no complaints   Objective: Vital signs in last 24 hours: Temp:  [99.5 F (37.5 C)] 99.5 F (37.5 C) (03/10 0527) Pulse Rate:  [71-83] 76 (03/10 0527) Resp:  [18-19] 18 (03/10 0527) BP: (101-122)/(49-52) 101/52 (03/10 0527) SpO2:  [91 %-100 %] 100 % (03/10 0527)   Intake/Output from previous day: 03/09 0701 - 03/10 0700 In: 2871.3 [P.O.:540; I.V.:1881.3; IV Piggyback:450] Out: 700 [Urine:700] Intake/Output this shift: No intake/output data recorded.   General appearance: appears stated age GI: normal findings: soft, non-tender  Incision: dressing itact  Lab Results:  Recent Labs    05/12/17 0552 05/13/17 0602  WBC 21.3* 21.9*  HGB 7.9* 8.0*  HCT 23.8* 24.3*  PLT 363 399   BMET Recent Labs    05/12/17 0552 05/13/17 0602  NA 138 138  K 4.2 4.2  CL 111 110  CO2 21* 23  GLUCOSE 131* 126*  BUN 18 19  CREATININE 1.04* 1.01*  CALCIUM 7.5* 7.5*   PT/INR No results for input(s): LABPROT, INR in the last 72 hours. ABG No results for input(s): PHART, HCO3 in the last 72 hours.  Invalid input(s): PCO2, PO2  MEDS, Scheduled . acetaminophen  500 mg Oral BID  . bisacodyl  10 mg Rectal Daily  . Chlorhexidine Gluconate Cloth  6 each Topical Q0600  . cholecalciferol  1,000 Units Oral Daily  . donepezil  10 mg Oral QHS  . feeding supplement  1 Container Oral TID BM  . heparin  5,000 Units Subcutaneous Q8H  . loratadine  10 mg Oral Daily  . memantine  5 mg Oral Daily  . multivitamin with minerals  1 tablet Oral Daily  . mupirocin ointment  1 application Nasal BID  . polyethylene glycol  34 g Oral BID  . risperiDONE  0.25 mg Oral BID    Studies/Results: No results found.  Assessment: s/p Procedure(s): IRRIGATION AND DEBRIDEMENT OF SACRAL DECUBUTIS ULCER  Patient Active Problem List   Diagnosis Date Noted  . Sepsis (HCC) 05/10/2017  . Sacral decubitus ulcer, stage IV (HCC) 05/10/2017  . Abscess  05/10/2017  . Leucocytosis 05/10/2017  . Elevated lactic acid level 05/10/2017  . Constipation, chronic 05/10/2017  . Elevated hemoglobin A1c 03/29/2016  . Sundowning 03/28/2016  . Nocturnal wandering 12/21/2015  . Vitamin D deficiency 12/03/2015  . CKD (chronic kidney disease) stage 3, GFR 30-59 ml/min (HCC)   . Essential hypertension   . Osteoporosis   . Alzheimer's dementia with behavioral disturbance 03/07/2015      Plan: Cont wet to dry BID dressing changes Wound RN consult   LOS: 3 days     .Vanita PandaAlicia C Jalal Rauch, MD [facility], [place] [phone]   05/13/2017 8:58 AM

## 2017-05-13 NOTE — Progress Notes (Addendum)
PROGRESS NOTE    Caroline Wallace  JXB:[id number] DOB: 1931-02-06 DOA: 05/10/2017 PCP: Natalia Leatherwood, DO   Brief Narrative: Caroline Wallace is a 84 y.o. female with medical history significant of advanced dementia, hypertension, CKD stage III, osteoporosis, nursing facility resident who was sent to the emergency department for the evaluation of worsening sacral ulcer and a new  draining abscess from the left buttock. She underwent Irrigation and Debridement sacral decubitus ulcer and drainage of gluteal abscess on 05/11/17.  She is currently on broad-spectrum antibiotics.   Assessment & Plan:   Principal Problem:   Sepsis (HCC) Active Problems:   CKD (chronic kidney disease) stage 3, GFR 30-59 ml/min (HCC)   Essential hypertension   Alzheimer's dementia with behavioral disturbance   Sacral decubitus ulcer, stage IV (HCC)   Abscess   Leucocytosis   Elevated lactic acid level   Constipation, chronic  Sepsis: Presented with elevated lactic acid level and white cell counts.  Will continue on broad-spectrum antibiotics with vancomycin and merem.  Patient is allergic to penicillin.  Blood pressure is currently stable. CT imaging was not suggestive of osteomyelitis. Wound cultures have shown E. coli resistant to most of the antibiotics.  Still has severe leukocytosis.  Stage III sacral decubitus ulcer/draining abscess of the left buttock: Ulcers looked infected, foul-smelling on presentation.  Underwent Irrigation and Debridement sacral decubitus ulcer and drainage of gluteal abscess  Lactic acidosis: Resolved. Continue IV fluids  AKI on CKD Stage 3: Kidney function should be close to baseline.  Continue IV fluids  Constipation:Large volume rectal stool noted in CT on presentation. Started on daily miralax.  Abnormal finding on CT: Showed Abnormal thickened appearance of the endometrium with 4.3 cm left adnexal mass. Nonemergent pelvic ultrasound has been recommended for further  evaluation radiology which can be ordered later after discussion with family.  The importance of further exploration on this finding is questionable on this elderly female.  Advanced dementia: Patient is alert and awakw but confused.  Might be this is her baseline.  We will continue to monitor her mental status.  We will resume her home dementia meds.  Hypertension: Currently blood pressure stable.  On lisinopril at home which has been held.  We will continue to monitor her blood pressure.  Deconditioning/debility: Requested  for physical therapy evaluation  DVT prophylaxis: heparin [place] Code Status: DNR Family Communication: None present at the bedside today Disposition Plan: SNF/assisted living facility after resolution of the ulcer   Consultants: General surgery  Procedures: Incision and drainage, debridement on 05/11/17  Antimicrobials: Vancomycin since 3/7/9                            Merem since 3/1                            Flagyl, cefepime since 05/10/17-05/13/17  Subjective: Patient seen and examined the bedside this morning.  Remains comfortable.  I was reported that she was agitated last night and took her IV lines out.  Objective: Vitals:   05/12/17 1700 05/12/17 2142 05/13/17 0527 05/13/17 1338  BP: (!) 101/49 (!) 122/51 (!) 101/52 119/73  Pulse: 71 83 76 81  Resp: 18 19 18 20   Temp: 99.5 F (37.5 C) 99.5 F (37.5 C) 99.5 F (37.5 C) 97.9 F (36.6 C)  TempSrc: Oral Oral Oral Oral  SpO2: 91% 100% 100% 100%  Weight:  Height:        Intake/Output Summary (Last 24 hours) at 05/13/2017 1407 Last data filed at 05/13/2017 1300 Gross per 24 hour  Intake 3231.25 ml  Output 1301 ml  Net 1930.25 ml   Filed Weights   05/10/17 1606  Weight: 70.3 kg (155 lb)    Examination:  General exam: Appears calm and comfortable ,Not in distress, chronically ill elderly female, frail HEENT:PERRL,Oral mucosa moist, Ear/Nose normal on gross exam Respiratory system: Bilateral  equal air entry, normal vesicular breath sounds, no wheezes or crackles  Cardiovascular system: S1 & S2 heard, RRR. No JVD, murmurs, rubs, gallops or clicks. Gastrointestinal system: Abdomen is nondistended, soft and nontender. No organomegaly or masses felt. Normal bowel sounds heard. Central nervous system: Alert but pleasantly confused. No focal neurological deficits. Extremities: No edema, no clubbing ,no cyanosis, distal peripheral pulses palpable. Skin: No rashes, lesions ,no icterus ,no pallor Sacral decubitus ulcers covered with ressing .   Data Reviewed: I have personally reviewed following labs and imaging studies  CBC: Recent Labs  Lab 05/10/17 1434 05/11/17 0634 05/12/17 0552 05/13/17 0602  WBC 21.6* 21.3* 21.3* 21.9*  NEUTROABS  --   --  18.7* 19.2*  HGB 9.9* 9.5* 7.9* 8.0*  HCT 30.2* 28.7* 23.8* 24.3*  MCV 94.4 92.3 91.9 93.1  PLT 397 392 363 399   Basic Metabolic Panel: Recent Labs  Lab 05/10/17 1434 05/11/17 0634 05/12/17 0552 05/13/17 0602  NA 142 142 138 138  K 4.5 4.4 4.2 4.2  CL 108 111 111 110  CO2 24 21* 21* 23  GLUCOSE 116* 140* 131* 126*  BUN 31* 20 18 19   CREATININE 1.09* 0.93 1.04* 1.01*  CALCIUM 8.7* 8.0* 7.5* 7.5*  MG  --  2.0  --   --    GFR: Estimated Creatinine Clearance: 39.2 mL/min (A) (by C-G formula based on SCr of 1.01 mg/dL (H)). Liver Function Tests: Recent Labs  Lab 05/11/17 0634  AST 18  ALT 18  ALKPHOS 59  BILITOT 0.4  PROT 6.9  ALBUMIN 2.4*   No results for input(s): LIPASE, AMYLASE in the last 168 hours. No results for input(s): AMMONIA in the last 168 hours. Coagulation Profile: No results for input(s): INR, PROTIME in the last 168 hours. Cardiac Enzymes: No results for input(s): CKTOTAL, CKMB, CKMBINDEX, TROPONINI in the last 168 hours. BNP (last 3 results) No results for input(s): PROBNP in the last 8760 hours. HbA1C: No results for input(s): HGBA1C in the last 72 hours. CBG: Recent Labs  Lab  05/11/17 0906 05/11/17 1138 05/11/17 1324  GLUCAP 133* 139* 103*   Lipid Profile: No results for input(s): CHOL, HDL, LDLCALC, TRIG, CHOLHDL, LDLDIRECT in the last 72 hours. Thyroid Function Tests: No results for input(s): TSH, T4TOTAL, FREET4, T3FREE, THYROIDAB in the last 72 hours. Anemia Panel: No results for input(s): VITAMINB12, FOLATE, FERRITIN, TIBC, IRON, RETICCTPCT in the last 72 hours. Sepsis Labs: Recent Labs  Lab 05/10/17 1621 05/10/17 1919 05/11/17 0634 05/13/17 0602  LATICACIDVEN 2.38* 1.32 2.0* 0.8    Recent Results (from the past 240 hour(s))  Blood culture (routine x 2)     Status: None (Preliminary result)   Collection Time: 05/10/17  4:16 PM  Result Value Ref Range Status   Specimen Description   Final    BLOOD LEFT ANTECUBITAL Performed at [facility], [address] [place], [place] [id number]    Special Requests   Final    BOTTLES DRAWN AEROBIC AND ANAEROBIC Blood  Culture adequate volume Performed at [facility], [address], [place], [place] [address]    Culture   Final    NO GROWTH 2 DAYS Performed at [facility], [address], [place], [place] [address]    Report Status PENDING  Incomplete  Blood culture (routine x 2)     Status: None (Preliminary result)   Collection Time: 05/10/17  4:30 PM  Result Value Ref Range Status   Specimen Description   Final    BLOOD RIGHT ANTECUBITAL Performed at [facility], [address], [place], [place] [address]    Special Requests   Final    BOTTLES DRAWN AEROBIC AND ANAEROBIC Blood Culture adequate volume Performed at [facility], [address], [place], [place] [address]    Culture   Final    NO GROWTH 2 DAYS Performed at [facility], [address], [place], [place] [address]    Report Status PENDING  Incomplete  Surgical PCR screen     Status: Abnormal   Collection Time: 05/11/17  5:59 AM  Result  Value Ref Range Status   MRSA, PCR POSITIVE (A) NEGATIVE Final    Comment: RESULT CALLED TO, READ BACK BY AND VERIFIED WITH: CAUDLE,E @ 1642 ON [id number] BY POTEAT,S    Staphylococcus aureus POSITIVE (A) NEGATIVE Final    Comment: (NOTE) The Xpert SA Assay (FDA approved for NASAL specimens in patients 18 years of age and older), is one component of a comprehensive surveillance program. It is not intended to diagnose infection nor to guide or monitor treatment. Performed at [facility], [address], [place], [place] [address]   Anaerobic culture     Status: None (Preliminary result)   Collection Time: 05/11/17 11:09 AM  Result Value Ref Range Status   Specimen Description   Final    WOUND SACRAL DECUBITUS ULCER Performed at [facility], [address], [place], [place] [address]    Special Requests   Final    PATIENT ON FOLLOWING CLINDAM/GENT Performed at [facility], [address], [place], [place] [address]    Culture   Final    HOLDING FOR POSSIBLE ANAEROBE Performed at [facility], [address] [place], [place] [address]    Report Status PENDING  Incomplete  Aerobic Culture (superficial specimen)     Status: None (Preliminary result)   Collection Time: 05/11/17 11:09 AM  Result Value Ref Range Status   Specimen Description   Final    WOUND SACRAL DECUB ULCER Performed at [facility], [address], [place], [place] [address]    Special Requests   Final    PATIENT ON FOLLOWING CLIND/GENT Performed at [facility], [address], [place], [place] [address]    Gram Stain   Final    RARE WBC PRESENT, PREDOMINANTLY MONONUCLEAR ABUNDANT GRAM NEGATIVE RODS ABUNDANT GRAM POSITIVE RODS ABUNDANT GRAM POSITIVE COCCI IN PAIRS AND CHAINS IN CLUSTERS    Culture   Final    FEW ESCHERICHIA COLI Confirmed Extended Spectrum Beta-Lactamase Producer (ESBL).  In bloodstream  infections from ESBL organisms, carbapenems are preferred over piperacillin/tazobactam. They are shown to have a lower risk of mortality. CULTURE REINCUBATED FOR BETTER GROWTH Performed at [facility], [address], [place], [place] [address]    Report Status PENDING  Incomplete   Organism ID, Bacteria ESCHERICHIA COLI  Final  Susceptibility   Escherichia coli - MIC*    AMPICILLIN >=32 RESISTANT Resistant     CEFAZOLIN >=64 RESISTANT Resistant     CEFEPIME RESISTANT Resistant     CEFTAZIDIME RESISTANT Resistant     CEFTRIAXONE RESISTANT Resistant     CIPROFLOXACIN >=4 RESISTANT Resistant     GENTAMICIN <=1 SENSITIVE Sensitive     IMIPENEM <=0.25 SENSITIVE Sensitive     TRIMETH/SULFA <=20 SENSITIVE Sensitive     AMPICILLIN/SULBACTAM 4 SENSITIVE Sensitive     PIP/TAZO <=4 SENSITIVE Sensitive     Extended ESBL POSITIVE Resistant     * FEW ESCHERICHIA COLI         Radiology Studies: No results found.      Scheduled Meds: . acetaminophen  500 mg Oral BID  . bisacodyl  10 mg Rectal Daily  . Chlorhexidine Gluconate Cloth  6 each Topical Q0600  . cholecalciferol  1,000 Units Oral Daily  . donepezil  10 mg Oral QHS  . feeding supplement  1 Container Oral TID BM  . heparin  5,000 Units Subcutaneous Q8H  . loratadine  10 mg Oral Daily  . memantine  5 mg Oral Daily  . multivitamin with minerals  1 tablet Oral Daily  . mupirocin ointment  1 application Nasal BID  . polyethylene glycol  34 g Oral BID  . risperiDONE  0.25 mg Oral BID   Continuous Infusions: . sodium chloride 75 mL/hr at 05/12/17 1759  . ceFEPime (MAXIPIME) IV Stopped (05/12/17 2142)  . metronidazole 500 mg (05/13/17 1117)  . vancomycin Stopped (05/12/17 2358)     LOS: 3 days    Time spent: 25 mins     Gregroy Dombkowski Salli Quarry, MD [facility] Pager [phone]  If 7PM-7AM, please contact night-coverage [email or web address] Password [email or web address] 05/13/2017, 2:07 PM

## 2017-05-14 DIAGNOSIS — Z1612 Extended spectrum beta lactamase (ESBL) resistance: Secondary | ICD-10-CM

## 2017-05-14 DIAGNOSIS — B9620 Unspecified Escherichia coli [E. coli] as the cause of diseases classified elsewhere: Secondary | ICD-10-CM

## 2017-05-14 DIAGNOSIS — F0281 Dementia in other diseases classified elsewhere with behavioral disturbance: Secondary | ICD-10-CM

## 2017-05-14 DIAGNOSIS — E46 Unspecified protein-calorie malnutrition: Secondary | ICD-10-CM

## 2017-05-14 DIAGNOSIS — Z978 Presence of other specified devices: Secondary | ICD-10-CM

## 2017-05-14 DIAGNOSIS — L0291 Cutaneous abscess, unspecified: Secondary | ICD-10-CM

## 2017-05-14 DIAGNOSIS — L89154 Pressure ulcer of sacral region, stage 4: Secondary | ICD-10-CM

## 2017-05-14 DIAGNOSIS — G309 Alzheimer's disease, unspecified: Secondary | ICD-10-CM

## 2017-05-14 DIAGNOSIS — N183 Chronic kidney disease, stage 3 (moderate): Secondary | ICD-10-CM

## 2017-05-14 DIAGNOSIS — I96 Gangrene, not elsewhere classified: Secondary | ICD-10-CM

## 2017-05-14 DIAGNOSIS — E440 Moderate protein-calorie malnutrition: Secondary | ICD-10-CM

## 2017-05-14 LAB — CBC WITH DIFFERENTIAL/PLATELET
BASOS PCT: 0 %
Basophils Absolute: 0 10*3/uL (ref 0.0–0.1)
Eosinophils Absolute: 0.6 10*3/uL (ref 0.0–0.7)
Eosinophils Relative: 3 %
HEMATOCRIT: 27.2 % — AB (ref 36.0–46.0)
Hemoglobin: 9 g/dL — ABNORMAL LOW (ref 12.0–15.0)
LYMPHS ABS: 1.3 10*3/uL (ref 0.7–4.0)
Lymphocytes Relative: 7 %
MCH: 30.7 pg (ref 26.0–34.0)
MCHC: 33.1 g/dL (ref 30.0–36.0)
MCV: 92.8 fL (ref 78.0–100.0)
MONOS PCT: 3 %
Monocytes Absolute: 0.6 10*3/uL (ref 0.1–1.0)
Neutro Abs: 16.5 10*3/uL — ABNORMAL HIGH (ref 1.7–7.7)
Neutrophils Relative %: 87 %
Platelets: 435 10*3/uL — ABNORMAL HIGH (ref 150–400)
RBC: 2.93 MIL/uL — ABNORMAL LOW (ref 3.87–5.11)
RDW: 13.9 % (ref 11.5–15.5)
WBC: 19 10*3/uL — ABNORMAL HIGH (ref 4.0–10.5)

## 2017-05-14 LAB — AEROBIC CULTURE W GRAM STAIN (SUPERFICIAL SPECIMEN)

## 2017-05-14 LAB — AEROBIC CULTURE  (SUPERFICIAL SPECIMEN)

## 2017-05-14 NOTE — Consult Note (Addendum)
[facility]    Date of Admission:  05/10/2017   Total days of antibiotics: 4 cefepime/flagyl/vanco --> merrem               Reason for Consult: decubitus ulcer    Referring Provider: Adhikari   Assessment: Decubitus Ulcer ESBL E coli abscess Dementia Protein calorie malnutrition (alb 2.4)  Plan: 1. Continue merrem,  2. Can change merrem to invanz at d/c for ease of dosing.  3. Plan for 21 days of carbapenem anbx (no osteo on imaging) 4. Will need PIC 5. Await anaerobe Cx (pending) 6. Continue nutrition eval 7. Airflow mattress 8. Continue wound care follow-pt out pt.  9. May need urine/stool diversion  Thank you for the consult,  Principal Problem:   Sepsis ([place]) Active Problems:   CKD (chronic kidney disease) stage 3, GFR 30-59 ml/min (HCC)   Essential hypertension   Alzheimer's dementia with behavioral disturbance   Sacral decubitus ulcer, stage IV (HCC)   Abscess   Leucocytosis   Elevated lactic acid level   Constipation, chronic   . acetaminophen  500 mg Oral BID  . bisacodyl  10 mg Rectal Daily  . Chlorhexidine Gluconate Cloth  6 each Topical Q0600  . cholecalciferol  1,000 Units Oral Daily  . donepezil  10 mg Oral QHS  . feeding supplement  1 Container Oral TID BM  . heparin  5,000 Units Subcutaneous Q8H  . loratadine  10 mg Oral Daily  . memantine  5 mg Oral Daily  . multivitamin with minerals  1 tablet Oral Daily  . mupirocin ointment  1 application Nasal BID  . polyethylene glycol  34 g Oral BID  . risperiDONE  0.25 mg Oral BID    HPI: Caroline Wallace is a 84 y.o. female with hx of dementia, CKD 3, adm from SNF on 3-7 with worsening of her sacral ulcer. At SNF they noted L buttock to be draining blood and purulence from a new abscess site. On adm she had CT scan showing decubitus ulcer with gas and tracts extending to sacrum and coccyx. Osteo was not seen. A 3.6 cm abscess was seen in the L gluteal region.  She  was started on vanco/cefepime/zosyn. She was eval by surgery who felt she had central necrosis/early gangrene.   She was taken to OR on 3-8 and necrotic skin over sacrum, L buttock/ischium was debrided.   Prev getting wound care visit tiw at SNF.  Last fever 3-8 (101.6)  .The past medical history, family history and social history were reviewed/updated in EPIC  Review of Systems: Review of Systems  Unable to perform ROS: Dementia    Past Medical History:  Diagnosis Date  . Allergy   . Altered mental status 2017  . CKD (chronic kidney disease), stage II    GFR 60s  . Dementia 2017  . Diabetes mellitus without complication ([place])   . Hypertension   . Osteoporosis   . Vitamin D deficiency     Social History   Tobacco Use  . Smoking status: Never Smoker  . Smokeless tobacco: Never Used  Substance Use Topics  . Alcohol use: No  . Drug use: No    Family History  Problem Relation Age of Onset  . Lung cancer Mother      Medications:  Scheduled: . acetaminophen  500 mg Oral BID  . bisacodyl  10 mg Rectal Daily  . Chlorhexidine Gluconate Cloth  6 each Topical Q0600  . cholecalciferol  1,000 Units Oral Daily  . donepezil  10 mg Oral QHS  . feeding supplement  1 Container Oral TID BM  . heparin  5,000 Units Subcutaneous Q8H  . loratadine  10 mg Oral Daily  . memantine  5 mg Oral Daily  . multivitamin with minerals  1 tablet Oral Daily  . mupirocin ointment  1 application Nasal BID  . polyethylene glycol  34 g Oral BID  . risperiDONE  0.25 mg Oral BID    Abtx:  Anti-infectives (From admission, onward)   Start     Dose/Rate Route Frequency Ordered Stop   05/13/17 1500  meropenem (MERREM) 2 g in sodium chloride 0.9 % 100 mL IVPB     2 g 200 mL/hr over 30 Minutes Intravenous Every 12 hours 05/13/17 1412     05/12/17 2000  vancomycin (VANCOCIN) 1,250 mg in sodium chloride 0.9 % 250 mL IVPB  Status:  Discontinued     1,250 mg 166.7 mL/hr over 90 Minutes Intravenous  Every 48 hours 05/10/17 2209 05/14/17 1234   05/11/17 2000  ceFEPIme (MAXIPIME) 1 g in sodium chloride 0.9 % 100 mL IVPB  Status:  Discontinued     1 g 200 mL/hr over 30 Minutes Intravenous Every 24 hours 05/10/17 1823 05/13/17 1412   05/11/17 0830  clindamycin (CLEOCIN) IVPB 900 mg     900 mg 100 mL/hr over 30 Minutes Intravenous On call to O.R. 05/11/17 0316 05/11/17 1016   05/11/17 0830  gentamicin (GARAMYCIN) 350 mg in dextrose 5 % 100 mL IVPB     5 mg/kg  70.3 kg 108.8 mL/hr over 60 Minutes Intravenous On call to O.R. 05/11/17 0316 05/11/17 1130   05/10/17 2030  vancomycin (VANCOCIN) 500 mg in sodium chloride 0.9 % 100 mL IVPB     500 mg 100 mL/hr over 60 Minutes Intravenous  Once 05/10/17 2013 05/10/17 2343   05/10/17 1830  vancomycin (VANCOCIN) 500 mg in sodium chloride 0.9 % 100 mL IVPB  Status:  Discontinued     500 mg 100 mL/hr over 60 Minutes Intravenous  Once 05/10/17 1819 05/10/17 2013   05/10/17 1800  metroNIDAZOLE (FLAGYL) IVPB 500 mg     500 mg 100 mL/hr over 60 Minutes Intravenous  Once 05/10/17 1757 05/10/17 2120   05/10/17 1800  ceFEPIme (MAXIPIME) 2 g in sodium chloride 0.9 % 100 mL IVPB     2 g 200 mL/hr over 30 Minutes Intravenous  Once 05/10/17 1757 05/10/17 2020   05/10/17 1630  vancomycin (VANCOCIN) 1,250 mg in sodium chloride 0.9 % 250 mL IVPB     1,250 mg 166.7 mL/hr over 90 Minutes Intravenous  Once 05/10/17 1606 05/10/17 2019   05/10/17 0200  metroNIDAZOLE (FLAGYL) IVPB 500 mg  Status:  Discontinued     500 mg 100 mL/hr over 60 Minutes Intravenous Every 8 hours 05/10/17 1821 05/13/17 1412        OBJECTIVE: Blood pressure 135/63, pulse 75, temperature 98.5 F (36.9 C), temperature source Oral, resp. rate 18, height 5' 4"  (1.626 m), weight 70.3 kg (155 lb), SpO2 100 %.  Physical Exam  Constitutional: She is well-developed, well-nourished, and in no distress. No distress.  Eyes: EOM are normal.  Neck: Neck supple.  Cardiovascular: Normal rate,  regular rhythm and normal heart sounds.  Pulmonary/Chest: Effort normal and breath sounds normal.  Abdominal: Soft. Bowel sounds are normal. There is no tenderness. There is no rebound.  Musculoskeletal: She  exhibits edema.  Lymphadenopathy:    She has no cervical adenopathy.  Skin:     Psychiatric: Mood normal.    Lab Results Results for orders placed or performed during the hospital encounter of 05/10/17 (from the past 48 hour(s))  CBC with Differential/Platelet     Status: Abnormal   Collection Time: 05/13/17  6:02 AM  Result Value Ref Range   WBC 21.9 (H) 4.0 - 10.5 K/uL   RBC 2.61 (L) 3.87 - 5.11 MIL/uL   Hemoglobin 8.0 (L) 12.0 - 15.0 g/dL   HCT 24.3 (L) 36.0 - 46.0 %   MCV 93.1 78.0 - 100.0 fL   MCH 30.7 26.0 - 34.0 pg   MCHC 32.9 30.0 - 36.0 g/dL   RDW 14.0 11.5 - 15.5 %   Platelets 399 150 - 400 K/uL   Neutrophils Relative % 89 %   Neutro Abs 19.2 (H) 1.7 - 7.7 K/uL   Lymphocytes Relative 5 %   Lymphs Abs 1.2 0.7 - 4.0 K/uL   Monocytes Relative 4 %   Monocytes Absolute 1.0 0.1 - 1.0 K/uL   Eosinophils Relative 2 %   Eosinophils Absolute 0.5 0.0 - 0.7 K/uL   Basophils Relative 0 %   Basophils Absolute 0.0 0.0 - 0.1 K/uL    Comment: Performed at [facility], [place] [address] [place], [place] [address]  Basic metabolic panel     Status: Abnormal   Collection Time: 05/13/17  6:02 AM  Result Value Ref Range   Sodium 138 135 - 145 mmol/L   Potassium 4.2 3.5 - 5.1 mmol/L   Chloride 110 101 - 111 mmol/L   CO2 23 22 - 32 mmol/L   Glucose, Bld 126 (H) 65 - 99 mg/dL   BUN 19 6 - 20 mg/dL   Creatinine, Ser 1.01 (H) 0.44 - 1.00 mg/dL   Calcium 7.5 (L) 8.9 - 10.3 mg/dL   GFR calc non Af Amer 49 (L) >60 mL/min   GFR calc Af Amer 57 (L) >60 mL/min    Comment: (NOTE) The eGFR has been calculated using the CKD EPI equation. This calculation has not been validated in all clinical situations. eGFR's persistently <60 mL/min signify possible Chronic  Kidney Disease.    Anion gap 5 5 - 15    Comment: Performed at [facility], [place] [address] [place], [place] [address]  Lactic acid, plasma     Status: None   Collection Time: 05/13/17  6:02 AM  Result Value Ref Range   Lactic Acid, Venous 0.8 0.5 - 1.9 mmol/L    Comment: Performed at [facility] [place] [address], [place], [place] [address]  CBC with Differential/Platelet     Status: Abnormal   Collection Time: 05/14/17  7:39 AM  Result Value Ref Range   WBC 19.0 (H) 4.0 - 10.5 K/uL   RBC 2.93 (L) 3.87 - 5.11 MIL/uL   Hemoglobin 9.0 (L) 12.0 - 15.0 g/dL   HCT 27.2 (L) 36.0 - 46.0 %   MCV 92.8 78.0 - 100.0 fL   MCH 30.7 26.0 - 34.0 pg   MCHC 33.1 30.0 - 36.0 g/dL   RDW 13.9 11.5 - 15.5 %   Platelets 435 (H) 150 - 400 K/uL   Neutrophils Relative % 87 %   Lymphocytes Relative 7 %   Monocytes Relative 3 %   Eosinophils Relative 3 %   Basophils Relative 0 %   Neutro Abs 16.5 (H) 1.7 - 7.7  K/uL   Lymphs Abs 1.3 0.7 - 4.0 K/uL   Monocytes Absolute 0.6 0.1 - 1.0 K/uL   Eosinophils Absolute 0.6 0.0 - 0.7 K/uL   Basophils Absolute 0.0 0.0 - 0.1 K/uL   WBC Morphology MILD LEFT SHIFT (1-5% METAS, OCC MYELO, OCC BANDS)     Comment: Performed at [facility], [place] [address], [place], [place] [address]      Component Value Date/Time   SDES  05/11/2017 1109    WOUND SACRAL DECUBITUS ULCER Performed at [facility], [place] [address] [place], [place] [address]    SDES  05/11/2017 1109    WOUND SACRAL DECUB ULCER Performed at [facility], [place] [address], [place], [place] [address]    SPECREQUEST  05/11/2017 1109    PATIENT ON FOLLOWING CLINDAM/GENT Performed at [place] [address], [place], [place] [address]    SPECREQUEST  05/11/2017 1109    PATIENT ON FOLLOWING CLIND/GENT Performed at [facility], [place] [address], [place], [place] [address]     CULT  05/11/2017 1109    HOLDING FOR POSSIBLE ANAEROBE Performed at [facility], [place] [address] [place], [place] [address]    CULT  05/11/2017 1109    FEW ESCHERICHIA COLI Confirmed Extended Spectrum Beta-Lactamase Producer (ESBL).  In bloodstream infections from ESBL organisms, carbapenems are preferred over piperacillin/tazobactam. They are shown to have a lower risk of mortality. Performed at [facility], [place] [address], [place], [place] [address]    REPTSTATUS PENDING 05/11/2017 1109   REPTSTATUS 05/14/2017 FINAL 05/11/2017 1109   No results found. Recent Results (from the past 240 hour(s))  Blood culture (routine x 2)     Status: None (Preliminary result)   Collection Time: 05/10/17  4:16 PM  Result Value Ref Range Status   Specimen Description   Final    BLOOD LEFT ANTECUBITAL Performed at [place] [address] [place], [place] [address]    Special Requests   Final    BOTTLES DRAWN AEROBIC AND ANAEROBIC Blood Culture adequate volume Performed at [place] [address] [place], [place] [address]    Culture   Final    NO GROWTH 4 DAYS Performed at [facility], [place] [address] [place], [place] [address]    Report Status PENDING  Incomplete  Blood culture (routine x 2)     Status: None (Preliminary result)   Collection Time: 05/10/17  4:30 PM  Result Value Ref Range Status   Specimen Description   Final    BLOOD RIGHT ANTECUBITAL Performed at [place] [address] [place],  [address]    Special Requests   Final    BOTTLES DRAWN AEROBIC AND ANAEROBIC Blood Culture adequate volume Performed at [place] [address] [place],  [address]    Culture   Final    NO GROWTH 4 DAYS Performed at [facility], [place] [address] [place],  [address]    Report Status PENDING  Incomplete  Surgical PCR screen     Status: Abnormal    Collection Time: 05/11/17  5:59 AM  Result Value Ref Range Status   MRSA, PCR POSITIVE (A) NEGATIVE Final    Comment: RESULT CALLED TO, READ BACK BY AND VERIFIED WITH: CAUDLE,E @ 1642 ON [id number] BY POTEAT,S    Staphylococcus aureus POSITIVE (A) NEGATIVE Final  Comment: (NOTE) The Xpert SA Assay (FDA approved for NASAL specimens in patients 45 years of age and older), is one component of a comprehensive surveillance program. It is not intended to diagnose infection nor to guide or monitor treatment. Performed at [facility], [place] [address] [place], [place] [address]   Anaerobic culture     Status: None (Preliminary result)   Collection Time: 05/11/17 11:09 AM  Result Value Ref Range Status   Specimen Description   Final    WOUND SACRAL DECUBITUS ULCER Performed at [place] [address] [place] [address] [place], [place] [address]    Special Requests   Final    PATIENT ON FOLLOWING CLINDAM/GENT Performed at [facility], [place] [address] [place], [place] [address]    Culture   Final    HOLDING FOR POSSIBLE ANAEROBE Performed at [facility], [place] [address] [place], [place] [address]    Report Status PENDING  Incomplete  Aerobic Culture (superficial specimen)     Status: None   Collection Time: 05/11/17 11:09 AM  Result Value Ref Range Status   Specimen Description   Final    WOUND SACRAL DECUB ULCER Performed at [place] [address] [place], [place] [address]    Special Requests   Final    PATIENT ON FOLLOWING CLIND/GENT Performed at [place] [address], [place], [place] [address]    Gram Stain   Final    RARE WBC PRESENT, PREDOMINANTLY MONONUCLEAR ABUNDANT GRAM NEGATIVE RODS ABUNDANT GRAM POSITIVE RODS ABUNDANT GRAM POSITIVE COCCI IN PAIRS AND CHAINS IN CLUSTERS    Culture   Final    FEW ESCHERICHIA COLI Confirmed Extended Spectrum Beta-Lactamase Producer  (ESBL).  In bloodstream infections from ESBL organisms, carbapenems are preferred over piperacillin/tazobactam. They are shown to have a lower risk of mortality. Performed at [facility], [place] [address] [place], [place] [address]    Report Status 05/14/2017 FINAL  Final   Organism ID, Bacteria ESCHERICHIA COLI  Final      Susceptibility   Escherichia coli - MIC*    AMPICILLIN >=32 RESISTANT Resistant     CEFAZOLIN >=64 RESISTANT Resistant     CEFEPIME RESISTANT Resistant     CEFTAZIDIME RESISTANT Resistant     CEFTRIAXONE RESISTANT Resistant     CIPROFLOXACIN >=4 RESISTANT Resistant     GENTAMICIN <=1 SENSITIVE Sensitive     IMIPENEM <=0.25 SENSITIVE Sensitive     TRIMETH/SULFA <=20 SENSITIVE Sensitive     AMPICILLIN/SULBACTAM 4 SENSITIVE Sensitive     PIP/TAZO <=4 SENSITIVE Sensitive     Extended ESBL POSITIVE Resistant     * FEW ESCHERICHIA COLI    Microbiology: Recent Results (from the past 240 hour(s))  Blood culture (routine x 2)     Status: None (Preliminary result)   Collection Time: 05/10/17  4:16 PM  Result Value Ref Range Status   Specimen Description   Final    BLOOD LEFT ANTECUBITAL Performed at [place] [address], [place], [place] [address]    Special Requests   Final    BOTTLES DRAWN AEROBIC AND ANAEROBIC Blood Culture adequate volume Performed at [place] [address], [place], [place] [address]    Culture   Final    NO GROWTH 4 DAYS Performed at [facility], [place] [address] [place], [place] [address]    Report Status PENDING  Incomplete  Blood culture (routine x 2)     Status: None (Preliminary result)   Collection Time: 05/10/17  4:30 PM  Result Value Ref Range Status   Specimen Description   Final    BLOOD RIGHT ANTECUBITAL Performed at [place] [address], [place], [place] [address]    Special Requests   Final    BOTTLES DRAWN AEROBIC AND ANAEROBIC Blood  Culture adequate volume Performed at [place] [address] [place], [place] [address]    Culture   Final    NO GROWTH 4 DAYS Performed at [facility], [place] [address] [place], [place] [address]    Report Status PENDING  Incomplete  Surgical PCR screen     Status: Abnormal   Collection Time: 05/11/17  5:59 AM  Result Value Ref Range Status   MRSA, PCR POSITIVE (A) NEGATIVE Final    Comment: RESULT CALLED TO, READ BACK BY AND VERIFIED WITH: CAUDLE,E @ 1642 ON [id number] BY POTEAT,S    Staphylococcus aureus POSITIVE (A) NEGATIVE Final    Comment: (NOTE) The Xpert SA Assay (FDA approved for NASAL specimens in patients 31 years of age and older), is one component of a comprehensive surveillance program. It is not intended to diagnose infection nor to guide or monitor treatment. Performed at [facility], [place] [address] [place], [place] [address]   Anaerobic culture     Status: None (Preliminary result)   Collection Time: 05/11/17 11:09 AM  Result Value Ref Range Status   Specimen Description   Final    WOUND SACRAL DECUBITUS ULCER Performed at [place] [address] [place], [place] [address]    Special Requests   Final    PATIENT ON FOLLOWING CLINDAM/GENT Performed at [facility], [place] [address], [place], [place] [address]    Culture   Final    HOLDING FOR POSSIBLE ANAEROBE Performed at [facility], [place] [address] [place], [place] [address]    Report Status PENDING  Incomplete  Aerobic Culture (superficial specimen)     Status: None   Collection Time: 05/11/17 11:09 AM  Result Value Ref Range Status   Specimen Description   Final    WOUND SACRAL DECUB ULCER Performed at [place] [address], [place], [place] [address]    Special Requests   Final    PATIENT ON FOLLOWING CLIND/GENT Performed at [place] [address] [place], [place] [address]    Gram Stain   Final    RARE WBC PRESENT, PREDOMINANTLY MONONUCLEAR ABUNDANT GRAM NEGATIVE RODS ABUNDANT GRAM POSITIVE RODS ABUNDANT GRAM POSITIVE COCCI IN PAIRS AND CHAINS IN CLUSTERS    Culture   Final    FEW ESCHERICHIA COLI Confirmed Extended Spectrum Beta-Lactamase Producer (ESBL).  In bloodstream infections from ESBL organisms, carbapenems are preferred over piperacillin/tazobactam. They are shown to have a lower risk of mortality. Performed at [facility], [place] [address], [place], [place] [address]    Report Status 05/14/2017 FINAL  Final   Organism ID, Bacteria ESCHERICHIA COLI  Final      Susceptibility   Escherichia coli - MIC*    AMPICILLIN >=32 RESISTANT Resistant     CEFAZOLIN >=64 RESISTANT Resistant     CEFEPIME RESISTANT Resistant     CEFTAZIDIME RESISTANT Resistant     CEFTRIAXONE RESISTANT Resistant     CIPROFLOXACIN >=4 RESISTANT Resistant  GENTAMICIN <=1 SENSITIVE Sensitive     IMIPENEM <=0.25 SENSITIVE Sensitive     TRIMETH/SULFA <=20 SENSITIVE Sensitive     AMPICILLIN/SULBACTAM 4 SENSITIVE Sensitive     PIP/TAZO <=4 SENSITIVE Sensitive     Extended ESBL POSITIVE Resistant     * FEW ESCHERICHIA COLI    Radiographs and labs were personally reviewed by me.   Bobby Rumpf, MD [facility] [phone] 05/14/2017, 5:15 PM

## 2017-05-14 NOTE — Progress Notes (Signed)
PROGRESS NOTE    Caroline Wallace  ZOX:[id number] DOB: [birth year] DOA: 05/10/2017 PCP: Natalia Leatherwood, DO   Brief Narrative: Caroline Wallace is a 84 y.o. female with medical history significant of advanced dementia, hypertension, CKD stage III, osteoporosis, nursing facility resident who was sent to the emergency department for the evaluation of worsening sacral ulcer and a new  draining abscess from the left buttock. She underwent Irrigation and Debridement sacral decubitus ulcer and drainage of gluteal abscess on 05/11/17.  She is currently on broad-spectrum antibiotics.   Assessment & Plan:   Principal Problem:   Sepsis (HCC) Active Problems:   CKD (chronic kidney disease) stage 3, GFR 30-59 ml/min (HCC)   Essential hypertension   Alzheimer's dementia with behavioral disturbance   Sacral decubitus ulcer, stage IV (HCC)   Abscess   Leucocytosis   Elevated lactic acid level   Constipation, chronic  Sepsis: Presented with elevated lactic acid level and white cell counts.  Will continue on broad-spectrum antibiotics  merem.  Patient is allergic to penicillin.  Blood pressure is currently stable.Vanco D/Ced CT imaging was not suggestive of osteomyelitis. Wound cultures have shown E. coli resistant to most of the antibiotics.  Still has severe leukocytosis. I have requested for evaluation by ID today.  Stage III sacral decubitus ulcer/draining abscess of the left buttock: Ulcers looked infected, foul-smelling on presentation.  Underwent Irrigation and Debridement sacral decubitus ulcer and drainage of gluteal abscess  Lactic acidosis: Resolved. Continue IV fluids  AKI on CKD Stage 3: Kidney function should be close to baseline.  Continue IV fluids  Constipation:Large volume rectal stool noted in CT on presentation. Started on daily miralax.Having bowel movements  Abnormal finding on CT: Showed Abnormal thickened appearance of the endometrium with 4.3 cm left adnexal mass.  Nonemergent pelvic ultrasound has been recommended for further evaluation by radiology.  I discussed this finding with her granddaughter Caroline Wallace who did not recommend any further intervention.  Advanced dementia: Patient is alert and awake.  .  We will continue to monitor her mental status.  We  resumed her home dementia meds.  Hypertension: Currently blood pressure stable.  On lisinopril at home which has been held.  We will continue to monitor her blood pressure.  Deconditioning/debility: Requested  for physical therapy evaluation.  Recommended skilled nursing facility.  Social worker consulted  DVT prophylaxis: heparin  Code Status: DNR Family Communication: None present at the bedside today Disposition Plan: SNF after ID evaluation and surgery clearance   Consultants: General surgery  Procedures: Incision and drainage, debridement on 05/11/17  Antimicrobials: Vancomycin from  3/7/9-3/11/19                            Merem since 3/10                            Flagyl, cefepime from  05/10/17-05/13/17  Subjective: Patient seen and examined the bedside this morning.  Remains comfortable.  More alert and oriented today.  Denies any discomfort  Objective: Vitals:   05/13/17 0527 05/13/17 1338 05/13/17 2105 05/14/17 0644  BP: (!) 101/52 119/73 128/74 135/63  Pulse: 76 81 83 75  Resp: 18 20 18 18   Temp: 99.5 F (37.5 C) 97.9 F (36.6 C) 99 F (37.2 C) 98.5 F (36.9 C)  TempSrc: Oral Oral Oral Oral  SpO2: 100% 100% 95% 100%  Weight:  Height:        Intake/Output Summary (Last 24 hours) at 05/14/2017 1234 Last data filed at 05/14/2017 0800 Gross per 24 hour  Intake 2618.75 ml  Output 500 ml  Net 2118.75 ml   Filed Weights   05/10/17 1606  Weight: 70.3 kg (155 lb)    Examination:  General exam: Appears calm and comfortable ,Not in distress,average built, elderly female HEENT:PERRL,Oral mucosa moist, Ear/Nose normal on gross exam Respiratory system: Bilateral equal  air entry, normal vesicular breath sounds, no wheezes or crackles  Cardiovascular system: S1 & S2 heard, RRR. No JVD, murmurs, rubs, gallops or clicks. Gastrointestinal system: Abdomen is nondistended, soft and nontender. No organomegaly or masses felt. Normal bowel sounds heard. Central nervous system: Alert , oriented to place and person. No focal neurological deficits. Extremities: No edema, no clubbing ,no cyanosis, distal peripheral pulses palpable. Skin: No rashes, lesions ,no icterus ,no pallor, sacral decubitus ulcer covered with dressing.  .   Data Reviewed: I have personally reviewed following labs and imaging studies  CBC: Recent Labs  Lab 05/10/17 1434 05/11/17 0634 05/12/17 0552 05/13/17 0602 05/14/17 0739  WBC 21.6* 21.3* 21.3* 21.9* 19.0*  NEUTROABS  --   --  18.7* 19.2* 16.5*  HGB 9.9* 9.5* 7.9* 8.0* 9.0*  HCT 30.2* 28.7* 23.8* 24.3* 27.2*  MCV 94.4 92.3 91.9 93.1 92.8  PLT 397 392 363 399 435*   Basic Metabolic Panel: Recent Labs  Lab 05/10/17 1434 05/11/17 0634 05/12/17 0552 05/13/17 0602  NA 142 142 138 138  K 4.5 4.4 4.2 4.2  CL 108 111 111 110  CO2 24 21* 21* 23  GLUCOSE 116* 140* 131* 126*  BUN 31* 20 18 19   CREATININE 1.09* 0.93 1.04* 1.01*  CALCIUM 8.7* 8.0* 7.5* 7.5*  MG  --  2.0  --   --    GFR: Estimated Creatinine Clearance: 39.2 mL/min (A) (by C-G formula based on SCr of 1.01 mg/dL (H)). Liver Function Tests: Recent Labs  Lab 05/11/17 0634  AST 18  ALT 18  ALKPHOS 59  BILITOT 0.4  PROT 6.9  ALBUMIN 2.4*   No results for input(s): LIPASE, AMYLASE in the last 168 hours. No results for input(s): AMMONIA in the last 168 hours. Coagulation Profile: No results for input(s): INR, PROTIME in the last 168 hours. Cardiac Enzymes: No results for input(s): CKTOTAL, CKMB, CKMBINDEX, TROPONINI in the last 168 hours. BNP (last 3 results) No results for input(s): PROBNP in the last 8760 hours. HbA1C: No results for input(s): HGBA1C in the  last 72 hours. CBG: Recent Labs  Lab 05/11/17 0906 05/11/17 1138 05/11/17 1324  GLUCAP 133* 139* 103*   Lipid Profile: No results for input(s): CHOL, HDL, LDLCALC, TRIG, CHOLHDL, LDLDIRECT in the last 72 hours. Thyroid Function Tests: No results for input(s): TSH, T4TOTAL, FREET4, T3FREE, THYROIDAB in the last 72 hours. Anemia Panel: No results for input(s): VITAMINB12, FOLATE, FERRITIN, TIBC, IRON, RETICCTPCT in the last 72 hours. Sepsis Labs: Recent Labs  Lab 05/10/17 1621 05/10/17 1919 05/11/17 0634 05/13/17 0602  LATICACIDVEN 2.38* 1.32 2.0* 0.8    Recent Results (from the past 240 hour(s))  Blood culture (routine x 2)     Status: None (Preliminary result)   Collection Time: 05/10/17  4:16 PM  Result Value Ref Range Status   Specimen Description   Final    BLOOD LEFT ANTECUBITAL Performed at [facility], [address] [place], [place] [id number]    Special Requests  Final    BOTTLES DRAWN AEROBIC AND ANAEROBIC Blood Culture adequate volume Performed at [facility], [address] [place], [place] [address]    Culture   Final    NO GROWTH 3 DAYS Performed at [facility], [address] [place], [place] [address]    Report Status PENDING  Incomplete  Blood culture (routine x 2)     Status: None (Preliminary result)   Collection Time: 05/10/17  4:30 PM  Result Value Ref Range Status   Specimen Description   Final    BLOOD RIGHT ANTECUBITAL Performed at [facility], [address] [place], [place] [address]    Special Requests   Final    BOTTLES DRAWN AEROBIC AND ANAEROBIC Blood Culture adequate volume Performed at [facility], [address] [place], [place] [address]    Culture   Final    NO GROWTH 3 DAYS Performed at [facility], [address] [place], [place] [address]    Report Status PENDING  Incomplete  Surgical PCR screen     Status: Abnormal    Collection Time: 05/11/17  5:59 AM  Result Value Ref Range Status   MRSA, PCR POSITIVE (A) NEGATIVE Final    Comment: RESULT CALLED TO, READ BACK BY AND VERIFIED WITH: CAUDLE,E @ 1642 ON [id number] BY POTEAT,S    Staphylococcus aureus POSITIVE (A) NEGATIVE Final    Comment: (NOTE) The Xpert SA Assay (FDA approved for NASAL specimens in patients 55 years of age and older), is one component of a comprehensive surveillance program. It is not intended to diagnose infection nor to guide or monitor treatment. Performed at [facility], [address], [place], [place] [address]   Anaerobic culture     Status: None (Preliminary result)   Collection Time: 05/11/17 11:09 AM  Result Value Ref Range Status   Specimen Description   Final    WOUND SACRAL DECUBITUS ULCER Performed at [facility], [address], [place], [place] [address]    Special Requests   Final    PATIENT ON FOLLOWING CLINDAM/GENT Performed at [facility], [address] [place], [place] [address]    Culture   Final    HOLDING FOR POSSIBLE ANAEROBE Performed at [facility], [address], [place], [place] [address]    Report Status PENDING  Incomplete  Aerobic Culture (superficial specimen)     Status: None (Preliminary result)   Collection Time: 05/11/17 11:09 AM  Result Value Ref Range Status   Specimen Description   Final    WOUND SACRAL DECUB ULCER Performed at [facility], [address], [place], [place] [address]    Special Requests   Final    PATIENT ON FOLLOWING CLIND/GENT Performed at [facility], [address] [place], [place] [address]    Gram Stain   Final    RARE WBC PRESENT, PREDOMINANTLY MONONUCLEAR ABUNDANT GRAM NEGATIVE RODS ABUNDANT GRAM POSITIVE RODS ABUNDANT GRAM POSITIVE COCCI IN PAIRS AND CHAINS IN CLUSTERS    Culture   Final    FEW ESCHERICHIA COLI Confirmed Extended Spectrum  Beta-Lactamase Producer (ESBL).  In bloodstream infections from ESBL organisms, carbapenems are preferred over piperacillin/tazobactam. They are shown to have a lower risk of mortality. CULTURE REINCUBATED FOR BETTER GROWTH Performed at [facility], [address] [place], [place] [address]    Report Status PENDING  Incomplete  Organism ID, Bacteria ESCHERICHIA COLI  Final      Susceptibility   Escherichia coli - MIC*    AMPICILLIN >=32 RESISTANT Resistant     CEFAZOLIN >=64 RESISTANT Resistant     CEFEPIME RESISTANT Resistant     CEFTAZIDIME RESISTANT Resistant     CEFTRIAXONE RESISTANT Resistant     CIPROFLOXACIN >=4 RESISTANT Resistant     GENTAMICIN <=1 SENSITIVE Sensitive     IMIPENEM <=0.25 SENSITIVE Sensitive     TRIMETH/SULFA <=20 SENSITIVE Sensitive     AMPICILLIN/SULBACTAM 4 SENSITIVE Sensitive     PIP/TAZO <=4 SENSITIVE Sensitive     Extended ESBL POSITIVE Resistant     * FEW ESCHERICHIA COLI         Radiology Studies: No results found.      Scheduled Meds: . acetaminophen  500 mg Oral BID  . bisacodyl  10 mg Rectal Daily  . Chlorhexidine Gluconate Cloth  6 each Topical Q0600  . cholecalciferol  1,000 Units Oral Daily  . donepezil  10 mg Oral QHS  . feeding supplement  1 Container Oral TID BM  . heparin  5,000 Units Subcutaneous Q8H  . loratadine  10 mg Oral Daily  . memantine  5 mg Oral Daily  . multivitamin with minerals  1 tablet Oral Daily  . mupirocin ointment  1 application Nasal BID  . polyethylene glycol  34 g Oral BID  . risperiDONE  0.25 mg Oral BID   Continuous Infusions: . sodium chloride 75 mL/hr at 05/14/17 0556  . meropenem (MERREM) IV Stopped (05/14/17 0415)     LOS: 4 days    Time spent: 25 mins     Caroline Milazzo Salli Quarry, MD [facility] Pager [phone]  If 7PM-7AM, please contact night-coverage [email or web address] Password [facility] 05/14/2017, 12:34 PM

## 2017-05-14 NOTE — Evaluation (Signed)
Physical Therapy Evaluation Patient Details Name: Caroline JohnsClara J Wallace MRN: [id number] DOB: 1930/04/29 Today's Date: 05/14/2017   History of Present Illness  84 yo female admitted with sepsis, sacral ulcer/L buttock abscess. S/P I&D+abscess drainage 3/8. Hx of dementia, CKD, osteoporosis. Pt is from an ALF    Clinical Impression  On eval, pt required Mod assist for bed mobility and Min assist for ambulation. She walked ~75 feet with a RW. When asked specifically, pt did state she had some pain in buttocks area. Pt presents with general weakness, decreased activity tolerance, and impaired gait and balance. She was able to follow 1 step commands with increased time and some repeated cueing. No family in room at time of eval. Recommend SNF for rehab. Will continue to follow.     Follow Up Recommendations SNF    Equipment Recommendations  None recommended by PT    Recommendations for Other Services       Precautions / Restrictions Precautions Precautions: Fall Precaution Comments: wound on buttocks/sacrum Restrictions Weight Bearing Restrictions: No      Mobility  Bed Mobility Overal bed mobility: Needs Assistance Bed Mobility: Rolling;Supine to Sit;Sit to Sidelying Rolling: Mod assist   Supine to sit: Mod assist;HOB elevated   Sit to sidelying: Mod assist General bed mobility comments: Assist for trunk and LEs. Multimodal cueing required. Increased time required.   Transfers Overall transfer level: Needs assistance Equipment used: 4-wheeled [place] Transfers: Sit to/from Stand Sit to Stand: Min assist;From elevated surface         General transfer comment: Assist to rise, stabilize/steady, control descent. Multimodal cueing required. Increased time.   Ambulation/Gait Ambulation/Gait assistance: Min assist Ambulation Distance (Feet): 75 Feet Assistive device: 4-wheeled Shock Gait Pattern/deviations: Step-through pattern;Decreased stride length     General Gait Details:  Assist needed to maneuver RW intermittently and to steady/stabilize pt throughout distance. Pt tolerated distance well.   Stairs            Wheelchair Mobility    Modified Rankin (Stroke Patients Only)       Balance Overall balance assessment: Needs assistance         Standing balance support: Bilateral upper extremity supported Standing balance-Leahy Scale: Poor                               Pertinent Vitals/Pain Pain Assessment: Faces Faces Pain Scale: Hurts little more Pain Location: buttocks Pain Descriptors / Indicators: Sore;Discomfort Pain Intervention(s): Limited activity within patient's tolerance;Repositioned    Home Living Family/patient expects to be discharged to:: Skilled nursing facility     Type of Home: Assisted living         Home Equipment: [place] - 4 wheels      Prior Function Level of Independence: Needs assistance   Gait / Transfers Assistance Needed: ambulatory with rollator           Hand Dominance        Extremity/Trunk Assessment   Upper Extremity Assessment Upper Extremity Assessment: Generalized weakness    Lower Extremity Assessment Lower Extremity Assessment: Generalized weakness    Cervical / Trunk Assessment Cervical / Trunk Assessment: Kyphotic  Communication   Communication: No difficulties  Cognition Arousal/Alertness: Awake/alert Behavior During Therapy: WFL for tasks assessed/performed Overall Cognitive Status: History of cognitive impairments - at baseline  General Comments      Exercises     Assessment/Plan    PT Assessment Patient needs continued PT services  PT Problem List Decreased strength;Decreased balance;Decreased mobility;Decreased activity tolerance;Pain;Decreased cognition;Decreased knowledge of use of DME       PT Treatment Interventions DME instruction;Gait training;Functional mobility training;Therapeutic  activities;Balance training;Patient/family education;Therapeutic exercise    PT Goals (Current goals can be found in the Care Plan section)  Acute Rehab PT Goals Patient Stated Goal: none stated.  PT Goal Formulation: Patient unable to participate in goal setting Time For Goal Achievement: 05/28/17 Potential to Achieve Goals: Fair    Frequency Min 2X/week   Barriers to discharge        Co-evaluation               AM-PAC PT "6 Clicks" Daily Activity  Outcome Measure Difficulty turning over in bed (including adjusting bedclothes, sheets and blankets)?: Unable Difficulty moving from lying on back to sitting on the side of the bed? : Unable Difficulty sitting down on and standing up from a chair with arms (e.g., wheelchair, bedside commode, etc,.)?: Unable Help needed moving to and from a bed to chair (including a wheelchair)?: A Little Help needed walking in hospital room?: A Little Help needed climbing 3-5 steps with a railing? : Total 6 Click Score: 10    End of Session Equipment Utilized During Treatment: Gait belt Activity Tolerance: Patient tolerated treatment well Patient left: in bed;with call bell/phone within reach;with bed alarm set   PT Visit Diagnosis: Muscle weakness (generalized) (M62.81);Difficulty in walking, not elsewhere classified (R26.2);Pain Pain - part of body: (buttocks s/p debridement)    Time: 1610-9604 PT Time Calculation (min) (ACUTE ONLY): 26 min   Charges:   PT Evaluation $PT Eval Moderate Complexity: 1 Mod PT Treatments $Gait Training: 8-22 mins   PT G Codes:        Rebeca Alert [facility] 05/14/2017, 10:54 AM

## 2017-05-14 NOTE — Progress Notes (Signed)
3 Days Post-Op    CC:  Sacral decubitus/gluteal abscess  Subjective: Dressing change in progress when I came in.  Picture below  Objective: Vital signs in last 24 hours: Temp:  [97.9 F (36.6 C)-99 F (37.2 C)] 98.5 F (36.9 C) (03/11 0644) Pulse Rate:  [75-83] 75 (03/11 0644) Resp:  [18-20] 18 (03/11 0644) BP: (119-135)/(63-74) 135/63 (03/11 0644) SpO2:  [95 %-100 %] 100 % (03/11 0644) Last BM Date: 05/14/17  Intake/Output from previous day: 03/10 0701 - 03/11 0700 In: 2738.8 [P.O.:810; I.V.:1728.8; IV Piggyback:200] Out: 1101 [Urine:1100; Stool:1] Intake/Output this shift: Total I/O In: 120 [P.O.:120] Out: -   General appearance: alert and no distress    Lab Results:  Recent Labs    05/13/17 0602 05/14/17 0739  WBC 21.9* 19.0*  HGB 8.0* 9.0*  HCT 24.3* 27.2*  PLT 399 435*    BMET Recent Labs    05/12/17 0552 05/13/17 0602  NA 138 138  K 4.2 4.2  CL 111 110  CO2 21* 23  GLUCOSE 131* 126*  BUN 18 19  CREATININE 1.04* 1.01*  CALCIUM 7.5* 7.5*   PT/INR No results for input(s): LABPROT, INR in the last 72 hours.  Recent Labs  Lab 05/11/17 0634  AST 18  ALT 18  ALKPHOS 59  BILITOT 0.4  PROT 6.9  ALBUMIN 2.4*     Lipase  No results found for: LIPASE   Medications: . acetaminophen  500 mg Oral BID  . bisacodyl  10 mg Rectal Daily  . Chlorhexidine Gluconate Cloth  6 each Topical Q0600  . cholecalciferol  1,000 Units Oral Daily  . donepezil  10 mg Oral QHS  . feeding supplement  1 Container Oral TID BM  . heparin  5,000 Units Subcutaneous Q8H  . loratadine  10 mg Oral Daily  . memantine  5 mg Oral Daily  . multivitamin with minerals  1 tablet Oral Daily  . mupirocin ointment  1 application Nasal BID  . polyethylene glycol  34 g Oral BID  . risperiDONE  0.25 mg Oral BID   . sodium chloride 75 mL/hr at 05/14/17 0556  . meropenem (MERREM) IV Stopped (05/14/17 0415)  . vancomycin Stopped (05/12/17 2358)   Assessment/Plan CKD (chronic  kidney disease) stage 3, GFR 30-59 ml/min (HCC)   Essential hypertension   Alzheimer's dementia with behavioral disturbance   Sacral decubitus ulcer, stage III (HCC)   Abscess  Sacral decubitus ulcer/abscess with gluteal abscess with sepsis I&D sacral decubitus, drainage of gluteal abscess, 05/11/17,DR. Byerly  FEN:  IV fluids/ heart healthy diet ID: cefepime  3/8-3/9; Flagyl 3/8- 3/10; Meropenem 3/10 =>> day 2  Vancomycin 3/7 => day 3 today DVT:  Heparin Follow up:  Byerly  Plan:  Continue wet to dry dressing, wounds look good.         LOS: 4 days    Caroline Wallace 05/14/2017 [phone]

## 2017-05-14 NOTE — NC FL2 (Signed)
[place] MEDICAID FL2 LEVEL OF CARE SCREENING TOOL     IDENTIFICATION  Patient Name: Caroline Wallace Birthdate: 01/25/1931 Sex: female Admission Date (Current Location): 05/10/2017  [facility] and [place] Number:  Producer, television/film/video and Address:  [facility],  [address], [place] [address]      Provider Number: [id number]  Attending Physician Name and Address:  Meredith Leeds, MD  Relative Name and Phone Number:       Current Level of Care: Hospital Recommended Level of Care: Skilled Nursing Facility Prior Approval Number:    Date Approved/Denied:   PASRR Number: [id number]  Discharge Plan: SNF    Current Diagnoses: Patient Active Problem List   Diagnosis Date Noted  . Sepsis (HCC) 05/10/2017  . Sacral decubitus ulcer, stage IV (HCC) 05/10/2017  . Abscess 05/10/2017  . Leucocytosis 05/10/2017  . Elevated lactic acid level 05/10/2017  . Constipation, chronic 05/10/2017  . Elevated hemoglobin A1c 03/29/2016  . Sundowning 03/28/2016  . Nocturnal wandering 12/21/2015  . Vitamin D deficiency 12/03/2015  . CKD (chronic kidney disease) stage 3, GFR 30-59 ml/min (HCC)   . Essential hypertension   . Osteoporosis   . Alzheimer's dementia with behavioral disturbance 03/07/2015    Orientation RESPIRATION BLADDER Height & Weight     Self, Place  Normal Incontinent Weight: 155 lb (70.3 kg) Height:  5\' 4"  (162.6 cm)  BEHAVIORAL SYMPTOMS/MOOD NEUROLOGICAL BOWEL NUTRITION STATUS  Other (Comment)(no behaviors)   Incontinent Diet(Heart)  AMBULATORY STATUS COMMUNICATION OF NEEDS Skin   Limited Assist Verbally PU Stage and Appropriate Care, Other (Comment)(Wound/Incision(OpenorDehisced)ButtocksLeft Dressing Type: Moist to Dry;Abdominal Pads; Foam;       Incision(Closed)03/08/19Buttocks Abdominal Pads; Mesh Briefs               )   PU Stage 2 Dressing:  BID( PressureInjury03/07/19StageII-Partialthicknesslossofdermispresentingasashallowopenulcerwithared,pinkwoundbedwithoutslough.  Location: Buttocks Location Orientation: Upper;Right   Moist to Dry Dressing) PU Stage 3 Dressing: BID(PressureInjuryStageIII-Fullthicknesstissueloss.Subcutaneousfatmaybevisiblebutbone,tendonormuscleareNOTexposed.   Location: Sacrum Location Orientation: Medial;Mid  Dressing Type Moist to Dry)                 Personal Care Assistance Level of Assistance  Bathing, Feeding, Dressing Bathing Assistance: Maximum assistance Feeding assistance: Independent Dressing Assistance: Maximum assistance     Functional Limitations Info  Sight, Hearing, Speech Sight Info: Adequate Hearing Info: Adequate Speech Info: Adequate    SPECIAL CARE FACTORS FREQUENCY  PT (By licensed PT), OT (By licensed OT)     PT Frequency: 5x/week OT Frequency: 5x/week            Contractures Contractures Info: Not present    Additional Factors Info  Code Status, Allergies, Isolation Precautions Code Status Info: DNR Allergies Info: Apple; Penicillins      Isolation Precautions Info: Contact Precautions; Infection: ESBL, MRSA     Current Medications (05/14/2017):  This is the current hospital active medication list Current Facility-Administered Medications  Medication Dose Route Frequency Provider Last Rate Last Dose  . 0.9 %  sodium chloride infusion   Intravenous Continuous Salli Quarry, Amrit, MD 75 mL/hr at 05/14/17 1411    . acetaminophen (TYLENOL) tablet 500 mg  500 mg Oral BID Salli Quarry, Amrit, MD   500 mg at 05/14/17 0926  . bisacodyl (DULCOLAX) suppository 10 mg  10 mg Rectal Daily Karie Soda, MD   10 mg at 05/14/17 0926  . Chlorhexidine Gluconate Cloth 2 % PADS 6 each  6 each Topical Q0600 Meredith Leeds, MD   6 each at 05/14/17  [id number]  . cholecalciferol (VITAMIN D) tablet 1,000 Units  1,000 Units Oral Daily Meredith LeedsAdhikari  Bk, Amrit, MD   1,000 Units at 05/14/17 0926  . cyclobenzaprine (FLEXERIL) tablet 5 mg  5 mg Oral BID PRN Meredith LeedsAdhikari Bk, Amrit, MD   5 mg at 05/11/17 2114  . donepezil (ARICEPT) tablet 10 mg  10 mg Oral QHS Salli QuarryAdhikari Bk, Amrit, MD   10 mg at 05/13/17 2233  . feeding supplement (BOOST / RESOURCE BREEZE) liquid 1 Container  1 Container Oral TID BM Meredith LeedsAdhikari Bk, Amrit, MD   1 Container at 05/14/17 1411  . heparin injection 5,000 Units  5,000 Units Subcutaneous Q8H Rayburn, Kelly A, PA-C   5,000 Units at 05/14/17 1411  . loratadine (CLARITIN) tablet 10 mg  10 mg Oral Daily Meredith LeedsAdhikari Bk, Amrit, MD   10 mg at 05/14/17 0926  . memantine (NAMENDA) tablet 5 mg  5 mg Oral Daily Meredith LeedsAdhikari Bk, Amrit, MD   5 mg at 05/14/17 0926  . meropenem (MERREM) 2 g in sodium chloride 0.9 % 100 mL IVPB  2 g Intravenous Q12H Salli QuarryAdhikari Bk, Amrit, MD 200 mL/hr at 05/14/17 1412 2 g at 05/14/17 1412  . multivitamin with minerals tablet 1 tablet  1 tablet Oral Daily Meredith LeedsAdhikari Bk, Amrit, MD   1 tablet at 05/14/17 0926  . mupirocin ointment (BACTROBAN) 2 % 1 application  1 application Nasal BID Meredith LeedsAdhikari Bk, Amrit, MD   1 application at 05/14/17 0928  . ondansetron (ZOFRAN-ODT) disintegrating tablet 4 mg  4 mg Oral Q6H PRN Adhikari Bk, Amrit, MD      . polyethylene glycol (MIRALAX / GLYCOLAX) packet 34 g  34 g Oral BID Karie SodaGross, Steven, MD   34 g at 05/14/17 0925  . risperiDONE (RISPERDAL) tablet 0.25 mg  0.25 mg Oral BID Meredith LeedsAdhikari Bk, Amrit, MD   0.25 mg at 05/14/17 [id number]     Discharge Medications: Please see discharge summary for a list of discharge medications.  Relevant Imaging Results:  Relevant Lab Results:   Additional Information SSN   [id number]  Antionette PolesKimberly L Johne Buckle, LCSW

## 2017-05-14 NOTE — Care Management Important Message (Signed)
Important Message  Patient Details  Name: Caroline Wallace MRN: [id number] Date of Birth: [birth year]   Medicare Important Message Given:  Yes    Caren MacadamFuller, Tomeca Helm 05/14/2017, 11:40 AMImportant Message  Patient Details  Name: Caroline Wallace MRN: [id number] Date of Birth: [birth year]   Medicare Important Message Given:  Yes    Caren MacadamFuller, Kabrea Seeney 05/14/2017, 11:40 AM

## 2017-05-14 NOTE — Clinical Social Work Note (Signed)
Clinical Social Work Assessment  Patient Details  Name: Caroline Wallace MRN: [id number] Date of Birth: December 28, 1930  Date of referral:  05/14/17               Reason for consult:  Facility Placement                Permission sought to share information with:    Permission granted to share information::     Name::        Agency::     Relationship::     Contact Information:     Housing/Transportation Living arrangements for the past 2 months:  Assisted Living Facility([facility]) Source of Information:  Adult Children(Kathy Mahbir [phone])) Patient Interpreter Needed:  None Criminal Activity/Legal Involvement Pertinent to Current Situation/Hospitalization:  No - Comment as needed Significant Relationships:  Adult Children Lives with:  Facility Resident Do you feel safe going back to the place where you live?  (PT recommending SNF) Need for family participation in patient care:  Yes (Comment)  Care giving concerns:  [facility]. Patient's daughter reported that patient has been at ALF for approx 1 year and prior to that patient was living with daughter. Patient's daughter reported that patient ambulated with [place] at baseline and requires assistance with ADLs. Patient admitted with sepsis, sacral ulcer/L buttock abscess. PT recommending SNF.   Social Worker assessment / plan:  CSW spoke with patient's daughter regarding PT recommendation for SNF, patient oriented x person and place and unable to participate in assessment. Patient's daughter reported that she is agreeable to SNF and prefers [facility]. CSW explained SNF process and insurance authorization, patient's daughter verbalized understanding.   CSW will complete FL2 and follow up with [facility]. CSW will start insurance authorization.  CSW will continue to follow and assist with discharge planning.  Employment status:  Retired Database administratornsurance information:  Managed Medicare PT Recommendations:   Skilled Nursing Facility Information / Referral to community resources:  Skilled Nursing Facility  Patient/Family's Response to care:  Patient's daughter appreciative of CSW assistance with discharge planning.  Patient/Family's Understanding of and Emotional Response to Diagnosis, Current Treatment, and Prognosis:  Patient's daughter involved in patient's care and verbalized strong understanding of patient's diagnosis and current treatment. Patient's daughter verbalized plan for patient to dc to SNF for ST rehab prior to [facility].   Emotional Assessment Appearance:    Attitude/Demeanor/Rapport:  Unable to Assess Affect (typically observed):  Unable to Assess Orientation:  Oriented to Self, Oriented to Place Alcohol / Substance use:  Not Applicable Psych involvement (Current and /or in the community):  No (Comment)  Discharge Needs  Concerns to be addressed:  Care Coordination Readmission within the last 30 days:  No Current discharge risk:  Other(Needs Wound Care) Barriers to Discharge:  Continued Medical Work up   USG CorporationKimberly L Tarra Pence, LCSW 05/14/2017, 1:27 PM

## 2017-05-14 NOTE — Consult Note (Signed)
WOC nurse consulted by hospitalist, after review of her chart CCS is managing the wound care for this patient after surgical debridement of her wounds.   WOC nurse will not consult for that reason   Re consult if needed, will not follow at this time. Thanks  Tykia Mellone M.D.C. Holdingsustin MSN, RN,CWOCN, CNS, CWON-AP [phone])

## 2017-05-15 DIAGNOSIS — Z880 Allergy status to penicillin: Secondary | ICD-10-CM

## 2017-05-15 DIAGNOSIS — A4151 Sepsis due to Escherichia coli [E. coli]: Secondary | ICD-10-CM

## 2017-05-15 DIAGNOSIS — B952 Enterococcus as the cause of diseases classified elsewhere: Secondary | ICD-10-CM

## 2017-05-15 DIAGNOSIS — Z95828 Presence of other vascular implants and grafts: Secondary | ICD-10-CM

## 2017-05-15 DIAGNOSIS — L8990 Pressure ulcer of unspecified site, unspecified stage: Secondary | ICD-10-CM

## 2017-05-15 DIAGNOSIS — F0390 Unspecified dementia without behavioral disturbance: Secondary | ICD-10-CM

## 2017-05-15 DIAGNOSIS — Z91018 Allergy to other foods: Secondary | ICD-10-CM

## 2017-05-15 LAB — CBC WITH DIFFERENTIAL/PLATELET
Basophils Absolute: 0 10*3/uL (ref 0.0–0.1)
Basophils Relative: 0 %
EOS PCT: 2 %
Eosinophils Absolute: 0.4 10*3/uL (ref 0.0–0.7)
HEMATOCRIT: 29.2 % — AB (ref 36.0–46.0)
Hemoglobin: 9.6 g/dL — ABNORMAL LOW (ref 12.0–15.0)
LYMPHS ABS: 1.8 10*3/uL (ref 0.7–4.0)
LYMPHS PCT: 9 %
MCH: 30.5 pg (ref 26.0–34.0)
MCHC: 32.9 g/dL (ref 30.0–36.0)
MCV: 92.7 fL (ref 78.0–100.0)
MONO ABS: 0.9 10*3/uL (ref 0.1–1.0)
MONOS PCT: 5 %
NEUTROS ABS: 15.9 10*3/uL — AB (ref 1.7–7.7)
Neutrophils Relative %: 84 %
PLATELETS: 424 10*3/uL — AB (ref 150–400)
RBC: 3.15 MIL/uL — ABNORMAL LOW (ref 3.87–5.11)
RDW: 13.8 % (ref 11.5–15.5)
WBC: 19 10*3/uL — ABNORMAL HIGH (ref 4.0–10.5)

## 2017-05-15 LAB — URINALYSIS, ROUTINE W REFLEX MICROSCOPIC
BILIRUBIN URINE: NEGATIVE
Glucose, UA: NEGATIVE mg/dL
Ketones, ur: 5 mg/dL — AB
Nitrite: NEGATIVE
PH: 5 (ref 5.0–8.0)
Protein, ur: NEGATIVE mg/dL
SPECIFIC GRAVITY, URINE: 1.017 (ref 1.005–1.030)

## 2017-05-15 LAB — CULTURE, BLOOD (ROUTINE X 2)
CULTURE: NO GROWTH
Culture: NO GROWTH
Special Requests: ADEQUATE
Special Requests: ADEQUATE

## 2017-05-15 LAB — ANAEROBIC CULTURE

## 2017-05-15 NOTE — Progress Notes (Addendum)
INFECTIOUS DISEASE PROGRESS NOTE  ID: Caroline Wallace is a 84 y.o. female with  Principal Problem:   Sepsis ([place]) Active Problems:   CKD (chronic kidney disease) stage 3, GFR 30-59 ml/min (HCC)   Essential hypertension   Alzheimer's dementia with behavioral disturbance   Sacral decubitus ulcer, stage IV (HCC)   Abscess   Leucocytosis   Elevated lactic acid level   Constipation, chronic   Moderate protein-calorie malnutrition (HCC)  Subjective: No complaints  Abtx:  Anti-infectives (From admission, onward)   Start     Dose/Rate Route Frequency Ordered Stop   05/13/17 1500  meropenem (MERREM) 2 g in sodium chloride 0.9 % 100 mL IVPB     2 g 200 mL/hr over 30 Minutes Intravenous Every 12 hours 05/13/17 1412     05/12/17 2000  vancomycin (VANCOCIN) 1,250 mg in sodium chloride 0.9 % 250 mL IVPB  Status:  Discontinued     1,250 mg 166.7 mL/hr over 90 Minutes Intravenous Every 48 hours 05/10/17 2209 05/14/17 1234   05/11/17 2000  ceFEPIme (MAXIPIME) 1 g in sodium chloride 0.9 % 100 mL IVPB  Status:  Discontinued     1 g 200 mL/hr over 30 Minutes Intravenous Every 24 hours 05/10/17 1823 05/13/17 1412   05/11/17 0830  clindamycin (CLEOCIN) IVPB 900 mg     900 mg 100 mL/hr over 30 Minutes Intravenous On call to O.R. 05/11/17 0316 05/11/17 1016   05/11/17 0830  gentamicin (GARAMYCIN) 350 mg in dextrose 5 % 100 mL IVPB     5 mg/kg  70.3 kg 108.8 mL/hr over 60 Minutes Intravenous On call to O.R. 05/11/17 0316 05/11/17 1130   05/10/17 2030  vancomycin (VANCOCIN) 500 mg in sodium chloride 0.9 % 100 mL IVPB     500 mg 100 mL/hr over 60 Minutes Intravenous  Once 05/10/17 2013 05/10/17 2343   05/10/17 1830  vancomycin (VANCOCIN) 500 mg in sodium chloride 0.9 % 100 mL IVPB  Status:  Discontinued     500 mg 100 mL/hr over 60 Minutes Intravenous  Once 05/10/17 1819 05/10/17 2013   05/10/17 1800  metroNIDAZOLE (FLAGYL) IVPB 500 mg     500 mg 100 mL/hr over 60 Minutes Intravenous  Once  05/10/17 1757 05/10/17 2120   05/10/17 1800  ceFEPIme (MAXIPIME) 2 g in sodium chloride 0.9 % 100 mL IVPB     2 g 200 mL/hr over 30 Minutes Intravenous  Once 05/10/17 1757 05/10/17 2020   05/10/17 1630  vancomycin (VANCOCIN) 1,250 mg in sodium chloride 0.9 % 250 mL IVPB     1,250 mg 166.7 mL/hr over 90 Minutes Intravenous  Once 05/10/17 1606 05/10/17 2019   05/10/17 0200  metroNIDAZOLE (FLAGYL) IVPB 500 mg  Status:  Discontinued     500 mg 100 mL/hr over 60 Minutes Intravenous Every 8 hours 05/10/17 1821 05/13/17 1412      Medications:  Scheduled: . acetaminophen  500 mg Oral BID  . bisacodyl  10 mg Rectal Daily  . Chlorhexidine Gluconate Cloth  6 each Topical Q0600  . cholecalciferol  1,000 Units Oral Daily  . donepezil  10 mg Oral QHS  . feeding supplement  1 Container Oral TID BM  . heparin  5,000 Units Subcutaneous Q8H  . loratadine  10 mg Oral Daily  . memantine  5 mg Oral Daily  . multivitamin with minerals  1 tablet Oral Daily  . mupirocin ointment  1 application Nasal BID  . polyethylene glycol  34  g Oral BID  . risperiDONE  0.25 mg Oral BID    Objective: Vital signs in last 24 hours: Temp:  [98.3 F (36.8 C)-99 F (37.2 C)] 99 F (37.2 C) (03/12 0645) Pulse Rate:  [76-84] 84 (03/12 0645) Resp:  [15] 15 (03/12 0645) BP: (111-117)/(48-51) 117/48 (03/12 0645) SpO2:  [98 %-100 %] 100 % (03/12 0645)   General appearance: alert, no distress and slowed mentation Resp: clear to auscultation bilaterally Cardio: regular rate and rhythm GI: normal findings: bowel sounds normal and soft, non-tender Extremities: edema 3+  Lab Results Recent Labs    05/13/17 0602 05/14/17 0739 05/15/17 0618  WBC 21.9* 19.0* 19.0*  HGB 8.0* 9.0* 9.6*  HCT 24.3* 27.2* 29.2*  NA 138  --   --   K 4.2  --   --   CL 110  --   --   CO2 23  --   --   BUN 19  --   --   CREATININE 1.01*  --   --    Liver Panel No results for input(s): PROT, ALBUMIN, AST, ALT, ALKPHOS, BILITOT,  BILIDIR, IBILI in the last 72 hours. Sedimentation Rate No results for input(s): ESRSEDRATE in the last 72 hours. C-Reactive Protein No results for input(s): CRP in the last 72 hours.  Microbiology: Recent Results (from the past 240 hour(s))  Blood culture (routine x 2)     Status: None (Preliminary result)   Collection Time: 05/10/17  4:16 PM  Result Value Ref Range Status   Specimen Description   Final    BLOOD LEFT ANTECUBITAL Performed at [facility] [address], [place], [place] [address]    Special Requests   Final    BOTTLES DRAWN AEROBIC AND ANAEROBIC Blood Culture adequate volume Performed at [facility] [address] [place], [place] [address]    Culture   Final    NO GROWTH 4 DAYS Performed at [facility], [place] [address], [place], [place] [address]    Report Status PENDING  Incomplete  Blood culture (routine x 2)     Status: None (Preliminary result)   Collection Time: 05/10/17  4:30 PM  Result Value Ref Range Status   Specimen Description   Final    BLOOD RIGHT ANTECUBITAL Performed at [facility] [address] [place], [place] [address]    Special Requests   Final    BOTTLES DRAWN AEROBIC AND ANAEROBIC Blood Culture adequate volume Performed at [facility] [address] [place], [place] [address]    Culture   Final    NO GROWTH 4 DAYS Performed at [facility], [place] [address] [place], [place] [address]    Report Status PENDING  Incomplete  Surgical PCR screen     Status: Abnormal   Collection Time: 05/11/17  5:59 AM  Result Value Ref Range Status   MRSA, PCR POSITIVE (A) NEGATIVE Final    Comment: RESULT CALLED TO, READ BACK BY AND VERIFIED WITH: CAUDLE,E @ 1642 ON [id number] BY POTEAT,S    Staphylococcus aureus POSITIVE (A) NEGATIVE Final    Comment: (NOTE) The Xpert SA Assay (FDA approved for NASAL specimens in patients 45 years of age and older), is  one component of a comprehensive surveillance program. It is not intended to diagnose infection nor to guide or monitor treatment. Performed at [facility], [place] [address] [place],  [address]   Anaerobic culture  Status: None (Preliminary result)   Collection Time: 05/11/17 11:09 AM  Result Value Ref Range Status   Specimen Description   Final    WOUND SACRAL DECUBITUS ULCER Performed at [facility] [address], [place], [place] [address]    Special Requests   Final    PATIENT ON FOLLOWING CLINDAM/GENT Performed at [facility], [place] [address] [place], [place] [address]    Culture   Final    HOLDING FOR POSSIBLE ANAEROBE Performed at [facility], [place] [address], [place], [place] [address]    Report Status PENDING  Incomplete  Aerobic Culture (superficial specimen)     Status: None   Collection Time: 05/11/17 11:09 AM  Result Value Ref Range Status   Specimen Description   Final    WOUND SACRAL DECUB ULCER Performed at [facility] [address], [place], [place] [address]    Special Requests   Final    PATIENT ON FOLLOWING CLIND/GENT Performed at [facility] [address] [place], [place] [address]    Gram Stain   Final    RARE WBC PRESENT, PREDOMINANTLY MONONUCLEAR ABUNDANT GRAM NEGATIVE RODS ABUNDANT GRAM POSITIVE RODS ABUNDANT GRAM POSITIVE COCCI IN PAIRS AND CHAINS IN CLUSTERS    Culture   Final    FEW ESCHERICHIA COLI Confirmed Extended Spectrum Beta-Lactamase Producer (ESBL).  In bloodstream infections from ESBL organisms, carbapenems are preferred over piperacillin/tazobactam. They are shown to have a lower risk of mortality. Performed at [facility], [place] [address] [place], [place] [address]    Report Status 05/14/2017 FINAL  Final   Organism ID, Bacteria ESCHERICHIA COLI  Final      Susceptibility   Escherichia coli - MIC*     AMPICILLIN >=32 RESISTANT Resistant     CEFAZOLIN >=64 RESISTANT Resistant     CEFEPIME RESISTANT Resistant     CEFTAZIDIME RESISTANT Resistant     CEFTRIAXONE RESISTANT Resistant     CIPROFLOXACIN >=4 RESISTANT Resistant     GENTAMICIN <=1 SENSITIVE Sensitive     IMIPENEM <=0.25 SENSITIVE Sensitive     TRIMETH/SULFA <=20 SENSITIVE Sensitive     AMPICILLIN/SULBACTAM 4 SENSITIVE Sensitive     PIP/TAZO <=4 SENSITIVE Sensitive     Extended ESBL POSITIVE Resistant     * FEW ESCHERICHIA COLI    Studies/Results: No results found.   Assessment/Plan: Decubitus Ulcer ESBL E coli abscess Dementia Protein calorie malnutrition (alb 2.4)   Total days of antibiotics: 5 merrem  Cx still pending for anaerobe growth Nutrition f/u Offloading WOC f/u Need to discuss with family diversion of stool, urine Pharmacy OPAT order placed for assistance with d/c anbx Will follow peripherally.    Allergies  Allergen Reactions  . Apple   . Penicillins Hives    OPAT Orders Discharge antibiotics: Invanz 1g IVPB q24h Duration: 21 days End Date: 06-03-17  [facility] Care Per Protocol: please  Labs weekly while on IV antibiotics: _x_ CBC with differential __ BMP _x_ CMP __ CRP __ ESR  _x_ Please pull PIC at completion of IV antibiotics __ Please leave PIC in place until doctor has seen patient or been notified  Fax weekly labs to [phone]  Clinic Follow Up Appt: PCP         Bobby Rumpf MD, FACP Infectious Diseases (pager) [phone] [email or web address] 05/15/2017, 10:56 AM  LOS: 5 days

## 2017-05-15 NOTE — Progress Notes (Signed)
PHARMACY CONSULT NOTE FOR:  OUTPATIENT  PARENTERAL ANTIBIOTIC THERAPY (OPAT)  Indication: ESBL Ecoli abscess Regimen: Invanz 1g IV q24 End date: 06/03/17  IV antibiotic discharge orders are pended. To discharging provider:  please sign these orders via discharge navigator,  Select New Orders & click on the button choice - Manage This Unsigned Work.     Thank you for allowing pharmacy to be a part of this patient's care.  Berkley HarveyLegge, Reanna Scoggin Marshall 05/15/2017, 11:43 AM

## 2017-05-15 NOTE — Progress Notes (Signed)
PHARMACY NOTE:  ANTIMICROBIAL RENAL DOSAGE ADJUSTMENT  Current antimicrobial regimen includes a mismatch between antimicrobial dosage and estimated renal function.  As per policy approved by the Pharmacy & Therapeutics and Medical Executive Committees, the antimicrobial dosage will be adjusted accordingly.  Current antimicrobial dosage:  meropenem  Indication: ESBL Ecoli abscess/cellulitis  Renal Function:  Estimated Creatinine Clearance: 39.2 mL/min (A) (by C-G formula based on SCr of 1.01 mg/dL (H)). []      On intermittent HD, scheduled: []      On CRRT    Antimicrobial dosage has been changed to:  Meropenem 1g IV q12  Additional comments:   Thank you for allowing pharmacy to be a part of this patient's care.  Berkley HarveyLegge, Kraven Calk Marshall, [facility] 05/15/2017 9:13 AM

## 2017-05-15 NOTE — Progress Notes (Signed)
CSW started patient's insurance authorization with [employer]). Patient's SNF selection pending. CSW will continue to follow and assist with discharge planning.   Celso SickleKimberly Kiwana Deblasi, ConnecticutLCSWA Clinical Social Worker [facility] Cell#: [phone]

## 2017-05-15 NOTE — Progress Notes (Signed)
PROGRESS NOTE    Caroline Wallace  ZOX:[id number] DOB: Aug 02, 1930 DOA: 05/10/2017 PCP: Natalia Leatherwood, DO   Brief Narrative: Caroline Wallace is a 84 y.o. female with medical history significant of advanced dementia, hypertension, CKD stage III, osteoporosis, nursing facility resident who was sent to the emergency department for the evaluation of worsening sacral ulcer and a new  draining abscess from the left buttock. She underwent Irrigation and Debridement sacral decubitus ulcer and drainage of gluteal abscess on 05/11/17.  Surgery is following.  ID was following.She has been started on meropenem.  Plan is to discharge her on a ertapenem. Physical therapy evaluated the patient and recommended for skilled nursing facility on discharge.    Assessment & Plan:   Principal Problem:   Sepsis (HCC) Active Problems:   CKD (chronic kidney disease) stage 3, GFR 30-59 ml/min (HCC)   Essential hypertension   Alzheimer's dementia with behavioral disturbance   Sacral decubitus ulcer, stage IV (HCC)   Abscess   Leucocytosis   Elevated lactic acid level   Constipation, chronic   Moderate protein-calorie malnutrition (HCC)  Sepsis: Presented with elevated lactic acid level and white cell counts.  Will continue on broad-spectrum antibiotics  merem.  Patient is allergic to penicillin.  Blood pressure is currently stable.Vanco D/Ced CT imaging was not suggestive of osteomyelitis. Wound cultures have shown E. coli resistant to most of the antibiotics.  Still has severe leukocytosis.  Stage III sacral decubitus ulcer/draining abscess of the left buttock: Ulcers looked infected, foul-smelling on presentation.  Underwent Irrigation and Debridement sacral decubitus ulcer and drainage of gluteal abscess. She will need wound care at a skilled nursing facility. Cultures grew ESBL E. Coli.  Currently she is on meropenem which will continue while she is hospitalized..  Plan is to discharge her on ertapenem.  Last  day will be 3/31.  She will get a PICC line today.  Lactic acidosis: Resolved. Continue gentle IV fluids  AKI on CKD Stage 3: Kidney function should be close to baseline.    Constipation:Large volume rectal stool noted in CT on presentation. Started on daily miralax.Having bowel movements  Abnormal finding on CT: Showed Abnormal thickened appearance of the endometrium with 4.3 cm left adnexal mass. Nonemergent pelvic ultrasound has been recommended for further evaluation by radiology.  I discussed this finding with her granddaughter Olegario Messier who did not recommend any further intervention.  Advanced dementia: Patient is alert and awake.   We will continue to monitor her mental status.  We  resumed her home dementia meds.  Hypertension: Currently blood pressure stable.  On lisinopril at home which has been held.  We will continue to monitor her blood pressure.  Deconditioning/debility: Requested  for physical therapy evaluation.  Recommended skilled nursing facility.  Social worker consulted.  DVT prophylaxis: heparin Howard Code Status: DNR Family Communication: None present at the bedside today Disposition Plan: SNF after surgery clearance   Consultants: General surgery  Procedures: Incision and drainage, debridement on 05/11/17  Antimicrobials: Vancomycin from  3/7/9-3/11/19                            Merem since 3/10                            Flagyl, cefepime from  05/10/17-05/13/17  Subjective: Patient seen and examined at bedside this morning.  Remains comfortable.  No new issues/events.  Objective: Vitals:   05/13/17 2105 05/14/17 0644 05/14/17 2248 05/15/17 0645  BP: 128/74 135/63 (!) 111/51 (!) 117/48  Pulse: 83 75 76 84  Resp: 18 18 15 15   Temp: 99 F (37.2 C) 98.5 F (36.9 C) 98.3 F (36.8 C) 99 F (37.2 C)  TempSrc: Oral Oral Oral Oral  SpO2: 95% 100% 98% 100%  Weight:      Height:        Intake/Output Summary (Last 24 hours) at 05/15/2017 1208 Last data filed  at 05/15/2017 0900 Gross per 24 hour  Intake 1371.25 ml  Output -  Net 1371.25 ml   Filed Weights   05/10/17 1606  Weight: 70.3 kg (155 lb)    Examination:  General exam: Appears calm and comfortable ,Not in distress, very pleasant elderly female HEENT:PERRL,Oral mucosa moist, Ear/Nose normal on gross exam Respiratory system: Bilateral equal air entry, normal vesicular breath sounds, no wheezes or crackles  Cardiovascular system: S1 & S2 heard, RRR. No JVD, murmurs, rubs, gallops or clicks. Gastrointestinal system: Abdomen is nondistended, soft and nontender. No organomegaly or masses felt. Normal bowel sounds heard. Central nervous system: Alert and oriented. No focal neurological deficits. Extremities: No edema, no clubbing ,no cyanosis, distal peripheral pulses palpable. Skin: No rashes, lesions,no icterus ,no pallor Sacral decubitus ulcer,left buttock ulcer  covered with dressings.   Data Reviewed: I have personally reviewed following labs and imaging studies  CBC: Recent Labs  Lab 05/11/17 0634 05/12/17 0552 05/13/17 0602 05/14/17 0739 05/15/17 0618  WBC 21.3* 21.3* 21.9* 19.0* 19.0*  NEUTROABS  --  18.7* 19.2* 16.5* 15.9*  HGB 9.5* 7.9* 8.0* 9.0* 9.6*  HCT 28.7* 23.8* 24.3* 27.2* 29.2*  MCV 92.3 91.9 93.1 92.8 92.7  PLT 392 363 399 435* 424*   Basic Metabolic Panel: Recent Labs  Lab 05/10/17 1434 05/11/17 0634 05/12/17 0552 05/13/17 0602  NA 142 142 138 138  K 4.5 4.4 4.2 4.2  CL 108 111 111 110  CO2 24 21* 21* 23  GLUCOSE 116* 140* 131* 126*  BUN 31* 20 18 19   CREATININE 1.09* 0.93 1.04* 1.01*  CALCIUM 8.7* 8.0* 7.5* 7.5*  MG  --  2.0  --   --    GFR: Estimated Creatinine Clearance: 39.2 mL/min (A) (by C-G formula based on SCr of 1.01 mg/dL (H)). Liver Function Tests: Recent Labs  Lab 05/11/17 0634  AST 18  ALT 18  ALKPHOS 59  BILITOT 0.4  PROT 6.9  ALBUMIN 2.4*   No results for input(s): LIPASE, AMYLASE in the last 168 hours. No results  for input(s): AMMONIA in the last 168 hours. Coagulation Profile: No results for input(s): INR, PROTIME in the last 168 hours. Cardiac Enzymes: No results for input(s): CKTOTAL, CKMB, CKMBINDEX, TROPONINI in the last 168 hours. BNP (last 3 results) No results for input(s): PROBNP in the last 8760 hours. HbA1C: No results for input(s): HGBA1C in the last 72 hours. CBG: Recent Labs  Lab 05/11/17 0906 05/11/17 1138 05/11/17 1324  GLUCAP 133* 139* 103*   Lipid Profile: No results for input(s): CHOL, HDL, LDLCALC, TRIG, CHOLHDL, LDLDIRECT in the last 72 hours. Thyroid Function Tests: No results for input(s): TSH, T4TOTAL, FREET4, T3FREE, THYROIDAB in the last 72 hours. Anemia Panel: No results for input(s): VITAMINB12, FOLATE, FERRITIN, TIBC, IRON, RETICCTPCT in the last 72 hours. Sepsis Labs: Recent Labs  Lab 05/10/17 1621 05/10/17 1919 05/11/17 0634 05/13/17 0602  LATICACIDVEN 2.38* 1.32 2.0* 0.8    Recent Results (from the past  240 hour(s))  Blood culture (routine x 2)     Status: None   Collection Time: 05/10/17  4:16 PM  Result Value Ref Range Status   Specimen Description   Final    BLOOD LEFT ANTECUBITAL Performed at [facility], [address] [place], [place] [address]    Special Requests   Final    BOTTLES DRAWN AEROBIC AND ANAEROBIC Blood Culture adequate volume Performed at [facility], [address] [place], [place] [address]    Culture   Final    NO GROWTH 5 DAYS Performed at [facility], [address] [place], [place] [address]    Report Status 05/15/2017 FINAL  Final  Blood culture (routine x 2)     Status: None   Collection Time: 05/10/17  4:30 PM  Result Value Ref Range Status   Specimen Description   Final    BLOOD RIGHT ANTECUBITAL Performed at [facility], [address] [place], [place] [address]    Special Requests   Final    BOTTLES DRAWN AEROBIC AND ANAEROBIC Blood  Culture adequate volume Performed at [facility], [address] [place], [place] [address]    Culture   Final    NO GROWTH 5 DAYS Performed at [facility], [address] [place], [place] [address]    Report Status 05/15/2017 FINAL  Final  Surgical PCR screen     Status: Abnormal   Collection Time: 05/11/17  5:59 AM  Result Value Ref Range Status   MRSA, PCR POSITIVE (A) NEGATIVE Final    Comment: RESULT CALLED TO, READ BACK BY AND VERIFIED WITH: CAUDLE,E @ 1642 ON [id number] BY POTEAT,S    Staphylococcus aureus POSITIVE (A) NEGATIVE Final    Comment: (NOTE) The Xpert SA Assay (FDA approved for NASAL specimens in patients 82 years of age and older), is one component of a comprehensive surveillance program. It is not intended to diagnose infection nor to guide or monitor treatment. Performed at [facility], [address] [place], [place] [address]   Anaerobic culture     Status: None   Collection Time: 05/11/17 11:09 AM  Result Value Ref Range Status   Specimen Description   Final    WOUND SACRAL DECUBITUS ULCER Performed at [facility], [address] [place], [place] [address]    Special Requests   Final    PATIENT ON FOLLOWING CLINDAM/GENT Performed at [facility], [address] [place], [place] [address]    Culture   Final    MIXED ANAEROBIC FLORA PRESENT.  CALL LAB IF FURTHER IID REQUIRED.   Report Status 05/15/2017 FINAL  Final  Aerobic Culture (superficial specimen)     Status: None   Collection Time: 05/11/17 11:09 AM  Result Value Ref Range Status   Specimen Description   Final    WOUND SACRAL DECUB ULCER Performed at [facility], [address] [place], [place] [address]    Special Requests   Final    PATIENT ON FOLLOWING CLIND/GENT Performed at [facility], [address] [place], [place] [address]    Gram Stain   Final     RARE WBC PRESENT, PREDOMINANTLY MONONUCLEAR ABUNDANT GRAM NEGATIVE RODS ABUNDANT GRAM POSITIVE RODS ABUNDANT GRAM POSITIVE COCCI IN PAIRS AND CHAINS IN CLUSTERS    Culture   Final    FEW ESCHERICHIA COLI Confirmed Extended Spectrum Beta-Lactamase  Producer (ESBL).  In bloodstream infections from ESBL organisms, carbapenems are preferred over piperacillin/tazobactam. They are shown to have a lower risk of mortality. Performed at [facility], [address], [place], [place] [address]    Report Status 05/14/2017 FINAL  Final   Organism ID, Bacteria ESCHERICHIA COLI  Final      Susceptibility   Escherichia coli - MIC*    AMPICILLIN >=32 RESISTANT Resistant     CEFAZOLIN >=64 RESISTANT Resistant     CEFEPIME RESISTANT Resistant     CEFTAZIDIME RESISTANT Resistant     CEFTRIAXONE RESISTANT Resistant     CIPROFLOXACIN >=4 RESISTANT Resistant     GENTAMICIN <=1 SENSITIVE Sensitive     IMIPENEM <=0.25 SENSITIVE Sensitive     TRIMETH/SULFA <=20 SENSITIVE Sensitive     AMPICILLIN/SULBACTAM 4 SENSITIVE Sensitive     PIP/TAZO <=4 SENSITIVE Sensitive     Extended ESBL POSITIVE Resistant     * FEW ESCHERICHIA COLI         Radiology Studies: No results found.      Scheduled Meds: . acetaminophen  500 mg Oral BID  . bisacodyl  10 mg Rectal Daily  . Chlorhexidine Gluconate Cloth  6 each Topical Q0600  . cholecalciferol  1,000 Units Oral Daily  . donepezil  10 mg Oral QHS  . feeding supplement  1 Container Oral TID BM  . heparin  5,000 Units Subcutaneous Q8H  . loratadine  10 mg Oral Daily  . memantine  5 mg Oral Daily  . multivitamin with minerals  1 tablet Oral Daily  . mupirocin ointment  1 application Nasal BID  . polyethylene glycol  34 g Oral BID  . risperiDONE  0.25 mg Oral BID   Continuous Infusions: . sodium chloride 75 mL/hr at 05/14/17 2051  . meropenem (MERREM) IV Stopped (05/15/17 0249)     LOS: 5 days    Time spent: 25 mins     Ermal Brzozowski Salli Quarry,  MD [facility] Pager [phone]  If 7PM-7AM, please contact night-coverage [email or web address] Password [facility] 05/15/2017, 12:08 PM

## 2017-05-16 ENCOUNTER — Inpatient Hospital Stay: Payer: Self-pay

## 2017-05-16 DIAGNOSIS — F0390 Unspecified dementia without behavioral disturbance: Secondary | ICD-10-CM | POA: Diagnosis not present

## 2017-05-16 DIAGNOSIS — J189 Pneumonia, unspecified organism: Secondary | ICD-10-CM | POA: Diagnosis not present

## 2017-05-16 DIAGNOSIS — L0291 Cutaneous abscess, unspecified: Secondary | ICD-10-CM | POA: Diagnosis not present

## 2017-05-16 DIAGNOSIS — R5381 Other malaise: Secondary | ICD-10-CM | POA: Diagnosis not present

## 2017-05-16 DIAGNOSIS — M6281 Muscle weakness (generalized): Secondary | ICD-10-CM | POA: Diagnosis not present

## 2017-05-16 DIAGNOSIS — R509 Fever, unspecified: Secondary | ICD-10-CM | POA: Diagnosis not present

## 2017-05-16 DIAGNOSIS — L8992 Pressure ulcer of unspecified site, stage 2: Secondary | ICD-10-CM | POA: Diagnosis not present

## 2017-05-16 DIAGNOSIS — K5909 Other constipation: Secondary | ICD-10-CM

## 2017-05-16 DIAGNOSIS — I10 Essential (primary) hypertension: Secondary | ICD-10-CM | POA: Diagnosis not present

## 2017-05-16 DIAGNOSIS — L89154 Pressure ulcer of sacral region, stage 4: Secondary | ICD-10-CM | POA: Diagnosis not present

## 2017-05-16 DIAGNOSIS — E440 Moderate protein-calorie malnutrition: Secondary | ICD-10-CM | POA: Diagnosis not present

## 2017-05-16 DIAGNOSIS — M4628 Osteomyelitis of vertebra, sacral and sacrococcygeal region: Secondary | ICD-10-CM | POA: Diagnosis not present

## 2017-05-16 DIAGNOSIS — L89159 Pressure ulcer of sacral region, unspecified stage: Secondary | ICD-10-CM | POA: Diagnosis not present

## 2017-05-16 DIAGNOSIS — R0989 Other specified symptoms and signs involving the circulatory and respiratory systems: Secondary | ICD-10-CM | POA: Diagnosis not present

## 2017-05-16 DIAGNOSIS — L89324 Pressure ulcer of left buttock, stage 4: Secondary | ICD-10-CM | POA: Diagnosis not present

## 2017-05-16 DIAGNOSIS — R4182 Altered mental status, unspecified: Secondary | ICD-10-CM | POA: Diagnosis not present

## 2017-05-16 DIAGNOSIS — R1319 Other dysphagia: Secondary | ICD-10-CM | POA: Diagnosis not present

## 2017-05-16 DIAGNOSIS — R5383 Other fatigue: Secondary | ICD-10-CM | POA: Diagnosis not present

## 2017-05-16 DIAGNOSIS — G308 Other Alzheimer's disease: Secondary | ICD-10-CM | POA: Diagnosis not present

## 2017-05-16 DIAGNOSIS — E860 Dehydration: Secondary | ICD-10-CM | POA: Diagnosis not present

## 2017-05-16 DIAGNOSIS — A419 Sepsis, unspecified organism: Secondary | ICD-10-CM | POA: Diagnosis not present

## 2017-05-16 DIAGNOSIS — A4151 Sepsis due to Escherichia coli [E. coli]: Secondary | ICD-10-CM | POA: Diagnosis not present

## 2017-05-16 DIAGNOSIS — N183 Chronic kidney disease, stage 3 (moderate): Secondary | ICD-10-CM | POA: Diagnosis not present

## 2017-05-16 DIAGNOSIS — L0231 Cutaneous abscess of buttock: Secondary | ICD-10-CM | POA: Diagnosis not present

## 2017-05-16 DIAGNOSIS — F0281 Dementia in other diseases classified elsewhere with behavioral disturbance: Secondary | ICD-10-CM | POA: Diagnosis not present

## 2017-05-16 DIAGNOSIS — E872 Acidosis: Secondary | ICD-10-CM | POA: Diagnosis not present

## 2017-05-16 DIAGNOSIS — L89899 Pressure ulcer of other site, unspecified stage: Secondary | ICD-10-CM | POA: Diagnosis not present

## 2017-05-16 DIAGNOSIS — R195 Other fecal abnormalities: Secondary | ICD-10-CM | POA: Diagnosis not present

## 2017-05-16 DIAGNOSIS — R05 Cough: Secondary | ICD-10-CM | POA: Diagnosis not present

## 2017-05-16 DIAGNOSIS — R41841 Cognitive communication deficit: Secondary | ICD-10-CM | POA: Diagnosis not present

## 2017-05-16 DIAGNOSIS — S161XXA Strain of muscle, fascia and tendon at neck level, initial encounter: Secondary | ICD-10-CM | POA: Diagnosis not present

## 2017-05-16 LAB — CBC WITH DIFFERENTIAL/PLATELET
BASOS ABS: 0 10*3/uL (ref 0.0–0.1)
BASOS PCT: 0 %
EOS ABS: 0.4 10*3/uL (ref 0.0–0.7)
Eosinophils Relative: 2 %
HCT: 30.9 % — ABNORMAL LOW (ref 36.0–46.0)
Hemoglobin: 10 g/dL — ABNORMAL LOW (ref 12.0–15.0)
Lymphocytes Relative: 10 %
Lymphs Abs: 1.7 10*3/uL (ref 0.7–4.0)
MCH: 30.1 pg (ref 26.0–34.0)
MCHC: 32.4 g/dL (ref 30.0–36.0)
MCV: 93.1 fL (ref 78.0–100.0)
MONO ABS: 0.7 10*3/uL (ref 0.1–1.0)
Monocytes Relative: 4 %
Neutro Abs: 15 10*3/uL — ABNORMAL HIGH (ref 1.7–7.7)
Neutrophils Relative %: 84 %
PLATELETS: 510 10*3/uL — AB (ref 150–400)
RBC: 3.32 MIL/uL — ABNORMAL LOW (ref 3.87–5.11)
RDW: 13.7 % (ref 11.5–15.5)
WBC: 17.8 10*3/uL — AB (ref 4.0–10.5)

## 2017-05-16 LAB — BASIC METABOLIC PANEL
ANION GAP: 9 (ref 5–15)
BUN: 12 mg/dL (ref 6–20)
CALCIUM: 8.4 mg/dL — AB (ref 8.9–10.3)
CO2: 25 mmol/L (ref 22–32)
Chloride: 104 mmol/L (ref 101–111)
Creatinine, Ser: 0.85 mg/dL (ref 0.44–1.00)
GFR calc Af Amer: >60 mL/min (ref >60)
GLUCOSE: 126 mg/dL — AB (ref 65–99)
Potassium: 3.7 mmol/L (ref 3.5–5.1)
SODIUM: 138 mmol/L (ref 135–145)

## 2017-05-16 LAB — URINE CULTURE: Culture: NO GROWTH

## 2017-05-16 MED ORDER — SODIUM CHLORIDE 0.9% FLUSH: 10.0000 mL | INTRAVENOUS | Status: DC | PRN

## 2017-05-16 MED ORDER — POLYETHYLENE GLYCOL 3350 17 G PO PACK: 34.0000 g | PACK | Freq: Every day | ORAL | 0 refills | Status: DC

## 2017-05-16 MED ORDER — SODIUM CHLORIDE 0.9 % IV SOLN
1.0000 g | Freq: Two times a day (BID) | INTRAVENOUS | Status: DC
  Administered 2017-05-16: 1 g via INTRAVENOUS
  Filled 2017-05-16: qty 1

## 2017-05-16 MED ORDER — LISINOPRIL 10 MG PO TABS: 10.0000 mg | ORAL_TABLET | Freq: Every day | ORAL | 0 refills | Status: DC

## 2017-05-16 MED ORDER — ERTAPENEM IV (FOR PTA / DISCHARGE USE ONLY): 1.0000 g | INTRAVENOUS | 0 refills | Status: AC

## 2017-05-16 NOTE — Progress Notes (Signed)
LCSW following for SNF placement.   Patient does not have a bed today. Facility can accept patient tomorrow.   LCSW notified attending of disposition.   Beulah GandyBernette Rosabella Edgin, LSCW [facility] [phone]

## 2017-05-16 NOTE — Progress Notes (Signed)
5 Days Post-Op    CC:  Sacral decubitus/gluteal abscess  Subjective:  Pt doing well, awaiting bed placement.  Wounds both look good, penrose still in place, no real change from 05/14/17/  Objective: Vital signs in last 24 hours: Temp:  [98.4 F (36.9 C)-98.7 F (37.1 C)] 98.4 F (36.9 C) (03/13 0518) Pulse Rate:  [69-71] 69 (03/13 0518) Resp:  [16-20] 20 (03/13 0518) BP: (126-127)/(51-61) 126/61 (03/13 0518) SpO2:  [100 %] 100 % (03/13 0518) Last BM Date: 05/16/17  Intake/Output from previous day: 03/12 0701 - 03/13 0700 In: 3636.3 [P.O.:360; I.V.:2976.3; IV Piggyback:300] Out: 1001 [Urine:1000; Stool:1] Intake/Output this shift: No intake/output data recorded.  General appearance: alert, cooperative and no distress Skin:  Sites all look good no drainage on the dressing.  Penrose in place.   Lab Results:  Recent Labs    05/15/17 0618 05/16/17 0623  WBC 19.0* 17.8*  HGB 9.6* 10.0*  HCT 29.2* 30.9*  PLT 424* 510*    BMET Recent Labs    05/16/17 0623  NA 138  K 3.7  CL 104  CO2 25  GLUCOSE 126*  BUN 12  CREATININE 0.85  CALCIUM 8.4*   PT/INR No results for input(s): LABPROT, INR in the last 72 hours.  Recent Labs  Lab 05/11/17 0634  AST 18  ALT 18  ALKPHOS 59  BILITOT 0.4  PROT 6.9  ALBUMIN 2.4*     Lipase  No results found for: LIPASE   Medications: . acetaminophen  500 mg Oral BID  . bisacodyl  10 mg Rectal Daily  . Chlorhexidine Gluconate Cloth  6 each Topical Q0600  . cholecalciferol  1,000 Units Oral Daily  . donepezil  10 mg Oral QHS  . feeding supplement  1 Container Oral TID BM  . heparin  5,000 Units Subcutaneous Q8H  . loratadine  10 mg Oral Daily  . memantine  5 mg Oral Daily  . multivitamin with minerals  1 tablet Oral Daily  . polyethylene glycol  34 g Oral BID  . risperiDONE  0.25 mg Oral BID    Assessment/Plan CKD (chronic kidney disease) stage 3, GFR 30-59 ml/min (HCC) Essential hypertension Alzheimer's  dementia with behavioral disturbance Sacral decubitus ulcer, stage III (HCC) Abscess  Sacral decubitus ulcer/abscesswith gluteal abscess with sepsis I&D sacral decubitus, drainage of gluteal abscess, 05/11/17,DR. Byerly  POD 5  FEN:  IV fluids/ heart healthy diet ID: cefepime  3/8-3/9; Flagyl 3/8- 3/10; Meropenem 3/10 =>> day 2  Vancomycin 3/7 => day 3 today DVT:  Heparin Follow up:  Byerly/Wound care  Plan:  Continue wound care, wet to dry at SNF.  We will eventually take her JP out in the office as this area heal.          LOS: 6 days    Welda Azzarello 05/16/2017 [phone]

## 2017-05-16 NOTE — Discharge Summary (Addendum)
PATIENT DETAILS Name: Caroline Wallace Age: 84 y.o. Sex: female Date of Birth: 1930-07-09 MRN: [id number]. Admitting Physician: Marene Lenz, MD EUM:PNTIRW, Reinaldo Raddle, DO  Admit Date: 05/10/2017 Discharge date: 05/16/2017  Recommendations for Outpatient Follow-up:  1. Follow up with PCP in 1-2 weeks 2. Please obtain BMP/CBC in one week 3. Last day of IV Invanz 3/31, please discontinue PICC line when patient completes course of IV antimicrobial therapy 4. Needs air mattress at SNF   Admitted From:  ALF   Disposition: SNF     Home Health: No  Equipment/Devices: 3/13>>PICC  Discharge Condition: Stable  CODE STATUS: DNR  Diet recommendation:  Heart Healthy   Brief Summary: See H&P, Labs, Consult and Test reports for all details in brief, patient is a 84 year old female with advanced dementia who presented to the emergency room for evaluation of draining/infected sacral decubitus ulcer-further evaluation revealed sepsis pathophysiology secondary to infected sacral decubitus ulcer, she was started on broad-spectrum antimicrobial therapy-evaluated by general surgery-underwent debridement. Subsequent cultures were positive for ESBL Escherichia coli-subsequently seen by infectious disease with recommendations to continue Invanz on discharge. See below for further details   Brief Hospital Course: Sepsis secondary to infected sacral decubitus ulcer/abscess of the left buttock: Sepsis pathophysiology has improved with initiation of empiric antimicrobial therapy, and debridement by general surgery. Wound cultures positive for ESBL Escherichia coli, subsequent he seen by infectious disease with recommendations to continue with Invanz until 3/31. PICC line was placed on 3/13. Please see below regarding obtaining weekly labs while on Invanz. Please pull PICC line once patient completes her course of antimicrobial therapy. Surgery recommends that we continue wet-to-dry dressing, please  ensure patient follows up with Dr. Barry Dienes.  Acute kidney injury on chronic disease stage III: Likely hemodynamically mediated-this has resolved.  Constipation: Resolved-continue daily MiraLAX on discharge.  Abnormal CT findings of a possible 4.3 cm left adnexal mass: Prior M.D.-Dr. Tawanna Solo spoke with patient's granddaughter- declined any further workup.  Hypertension: Resume lisinopril at a lower dose. Optimize accordingly in the outpatient setting  Advanced dementia: Stable-resume usual medications on discharge  Procedures/Studies: Incision and drainage, debridement on 05/11/17  Discharge Diagnoses:  Principal Problem:   Sepsis ([place]) Active Problems:   CKD (chronic kidney disease) stage 3, GFR 30-59 ml/min (HCC)   Essential hypertension   Alzheimer's dementia with behavioral disturbance   Sacral decubitus ulcer, stage IV (HCC)   Abscess   Leucocytosis   Elevated lactic acid level   Constipation, chronic   Moderate protein-calorie malnutrition ([place])   Discharge Instructions:  Activity:  As tolerated with Full fall precautions use Sian/cane & assistance as needed   Discharge Instructions    Diet - low sodium heart healthy   Complete by:  As directed    Home infusion instructions [facility] May follow [place] Dosing Protocol; May administer Cathflo as needed to maintain patency of vascular access device.; Flushing of vascular access device: per [facility] Protocol: 0.9% NaCl pre/post medica...   Complete by:  As directed    Instructions:  May follow [place] Dosing Protocol   Instructions:  May administer Cathflo as needed to maintain patency of vascular access device.   Instructions:  Flushing of vascular access device: per [facility] Protocol: 0.9% NaCl pre/post medication administration and prn patency; Heparin 100 u/ml, 58m for implanted ports and Heparin 10u/ml, 5106mfor all other central venous catheters.   Instructions:  May follow [facility] Anaphylaxis Protocol for  First Dose Administration in the home: 0.9% NaCl  at 25-50 ml/hr to maintain IV access for protocol meds. Epinephrine 0.3 ml IV/IM PRN and Benadryl 25-50 IV/IM PRN s/s of anaphylaxis.   Instructions:  [facility] Infusion Coordinator (RN) to assist per patient IV care needs in the home PRN.   Increase activity slowly   Complete by:  As directed      Allergies as of 05/16/2017      Reactions   Apple    Penicillins Hives      Medication List    TAKE these medications   acetaminophen 500 MG tablet Commonly known as:  TYLENOL Take 500 mg by mouth 2 (two) times daily.   cyclobenzaprine 5 MG tablet Commonly known as:  FLEXERIL Take 1 tablet (5 mg total) by mouth 2 (two) times daily as needed for muscle spasms.   donepezil 10 MG tablet Commonly known as:  ARICEPT Take 10 mg by mouth at bedtime.   ertapenem IVPB Commonly known as:  INVANZ Inject 1 g into the vein daily for 19 days. Indication:  ESBL Ecoli abscess Last Day of Therapy:  06/03/17 Labs - Once weekly:  CBC/D and BMP, Labs - Every other week:  ESR and CRP   lisinopril 10 MG tablet Commonly known as:  PRINIVIL,ZESTRIL Take 1 tablet (10 mg total) by mouth daily. What changed:    medication strength  how much to take   loratadine 10 MG tablet Commonly known as:  CLARITIN Take 1 tablet (10 mg total) by mouth daily.   memantine 5 MG tablet Commonly known as:  NAMENDA Take 5 mg by mouth daily.   ondansetron 4 MG disintegrating tablet Commonly known as:  ZOFRAN-ODT Take 4 mg by mouth every 6 (six) hours as needed for nausea.   polyethylene glycol packet Commonly known as:  MIRALAX / GLYCOLAX Take 34 g by mouth daily.   risperiDONE 0.25 MG tablet Commonly known as:  RISPERDAL Take 0.25 mg by mouth 2 (two) times daily.   trolamine salicylate 10 % cream Commonly known as:  ASPERCREME Apply 1 application topically 2 (two) times daily.   VITAMIN D-1000 MAX ST 1000 units tablet Generic drug:   Cholecalciferol Take 1,000 Units by mouth daily.            Home Infusion Instuctions  (From admission, onward)        Start     Ordered   05/16/17 0000  Home infusion instructions [facility] May follow [facility] Dosing Protocol; May administer Cathflo as needed to maintain patency of vascular access device.; Flushing of vascular access device: per [facility] Protocol: 0.9% NaCl pre/post medica...    Question Answer Comment  Instructions May follow [place] Dosing Protocol   Instructions May administer Cathflo as needed to maintain patency of vascular access device.   Instructions Flushing of vascular access device: per [facility] Protocol: 0.9% NaCl pre/post medication administration and prn patency; Heparin 100 u/ml, 57m for implanted ports and Heparin 10u/ml, 550mfor all other central venous catheters.   Instructions May follow [facility] Anaphylaxis Protocol for First Dose Administration in the home: 0.9% NaCl at 25-50 ml/hr to maintain IV access for protocol meds. Epinephrine 0.3 ml IV/IM PRN and Benadryl 25-50 IV/IM PRN s/s of anaphylaxis.   Instructions [facility] Infusion Coordinator (RN) to assist per patient IV care needs in the home PRN.      05/16/17 1031     Follow-up Information    Kuneff, Renee A, DO. Schedule an appointment as soon as  possible for a visit in 1 week(s).   Specialty:  Family Medicine Contact information: [address] [place] [address] [phone]        Stark Klein, MD. Schedule an appointment as soon as possible for a visit in 2 week(s).   Specialty:  General Surgery Contact information: [address] [place] [address] [phone]          Allergies  Allergen Reactions  . Apple   . Penicillins Hives    Consultations:   ID and general surgery  Other Procedures/Studies: Ct Abdomen Pelvis W Contrast  Result Date: 05/10/2017 CLINICAL DATA:  Sacral/buttock ulcers. EXAM: CT ABDOMEN AND PELVIS WITH CONTRAST  TECHNIQUE: Multidetector CT imaging of the abdomen and pelvis was performed using the standard protocol following bolus administration of intravenous contrast. CONTRAST:  171m ISOVUE-300 IOPAMIDOL (ISOVUE-300) INJECTION 61% COMPARISON:  Abdominal radiographs 01/06/2016 FINDINGS: Lower chest: Motion artifact through the lung bases with mild bibasilar scarring and/or atelectasis. No pleural effusion. Three-vessel coronary artery atherosclerosis. Hepatobiliary: Slight exclusion of the right hepatic dome. No focal liver abnormality identified. Unremarkable gallbladder. No biliary dilatation. Pancreas: Unremarkable. Spleen: Unremarkable. Adrenals/Urinary Tract: Unremarkable adrenal glands. 4.5 cm left upper pole renal cyst with a few thin internal septations. No renal calculi or hydronephrosis. Decompressed bladder. Stomach/Bowel: Small sliding hiatal hernia. Large amount of stool in the rectum with a moderate amount of stool in the colon. Redundant sigmoid colon. No evidence of bowel obstruction. Unremarkable appendix. Vascular/Lymphatic: Diffuse atherosclerosis and tortuosity of the abdominal aorta without aneurysm. Suspected at least moderate stenoses of the celiac and superior mesenteric artery origins. No enlarged lymph nodes. Reproductive: Thickened appearance of the endometrium. 4.3 x 2.5 cm homogeneously hyperattenuating or enhancing left adnexal mass, possibly arising from the left ovary. Grossly unremarkable right ovary. Other: No intraperitoneal free fluid.  No abdominal wall hernia. Musculoskeletal: Sacral decubitus ulcer to the right of midline contains gas and a small amount of fluid and extends to the posterior margin of the lower sacrum and coccyx without evidence of underlying osseous erosion. Additional gas extends more superiorly posterior to the sacrum without significant fluid component. There is also a 2.2 x 1.6 cm gas and fluid collection posterior to the tip of the coccyx which is contiguous with  the above described ulcer more superiorly. Surrounding soft tissue thickening and inflammatory stranding are present. More inferiorly, there is a 3.6 x 2.2 cm rim enhancing subcutaneous fluid collection in the inferior left gluteal region (series 2, image 84). Mild diffuse lumbar disc degeneration is noted. IMPRESSION: 1. Sacral decubitus ulcer with gas and fluid-filled tracts extending to the posterior sacrum and coccyx. No osseous erosion to suggest osteomyelitis. 2. 3.6 cm subcutaneous abscess in the inferior left gluteal region. 3. Large volume rectal stool. 4. Abnormal thickened appearance of the endometrium with 4.3 cm left adnexal mass. Nonemergent pelvic ultrasound is recommended for further evaluation. 5. Minimally complex 4.5 cm left renal cyst. 6.  Aortic Atherosclerosis (ICD10-I70.0). Electronically Signed   By: ALogan BoresM.D.   On: 05/10/2017 17:22   [place] Site Rite  Result Date: 05/16/2017 If Site Rite image not attached, placement could not be confirmed due to current cardiac rhythm.    TODAY-DAY OF DISCHARGE:  Subjective:   Tomeika Difranco today remains pleasantly confused-no major issues overnight per nursing staff.  Objective:   Blood pressure 126/61, pulse 69, temperature 98.4 F (36.9 C), temperature source Oral, resp. rate 20, height 5' 4"  (1.626 m), weight 70.3 kg (  155 lb), SpO2 100 %.  Intake/Output Summary (Last 24 hours) at 05/16/2017 1042 Last data filed at 05/16/2017 0641 Gross per 24 hour  Intake 3516.25 ml  Output 1001 ml  Net 2515.25 ml   Filed Weights   05/10/17 1606  Weight: 70.3 kg (155 lb)    Exam: Awake , No new F.N deficits, Normal affect [place].AT,PERRAL Supple Neck,No JVD, No cervical lymphadenopathy appriciated.  Symmetrical Chest wall movement, Good air movement bilaterally, CTAB RRR,No Gallops,Rubs or new Murmurs, No Parasternal Heave +ve B.Sounds, Abd Soft, Non tender, No organomegaly appriciated, No rebound -guarding or rigidity. No  Cyanosis, Clubbing or edema, No new Rash or bruise   PERTINENT RADIOLOGIC STUDIES: Ct Abdomen Pelvis W Contrast  Result Date: 05/10/2017 CLINICAL DATA:  Sacral/buttock ulcers. EXAM: CT ABDOMEN AND PELVIS WITH CONTRAST TECHNIQUE: Multidetector CT imaging of the abdomen and pelvis was performed using the standard protocol following bolus administration of intravenous contrast. CONTRAST:  142m ISOVUE-300 IOPAMIDOL (ISOVUE-300) INJECTION 61% COMPARISON:  Abdominal radiographs 01/06/2016 FINDINGS: Lower chest: Motion artifact through the lung bases with mild bibasilar scarring and/or atelectasis. No pleural effusion. Three-vessel coronary artery atherosclerosis. Hepatobiliary: Slight exclusion of the right hepatic dome. No focal liver abnormality identified. Unremarkable gallbladder. No biliary dilatation. Pancreas: Unremarkable. Spleen: Unremarkable. Adrenals/Urinary Tract: Unremarkable adrenal glands. 4.5 cm left upper pole renal cyst with a few thin internal septations. No renal calculi or hydronephrosis. Decompressed bladder. Stomach/Bowel: Small sliding hiatal hernia. Large amount of stool in the rectum with a moderate amount of stool in the colon. Redundant sigmoid colon. No evidence of bowel obstruction. Unremarkable appendix. Vascular/Lymphatic: Diffuse atherosclerosis and tortuosity of the abdominal aorta without aneurysm. Suspected at least moderate stenoses of the celiac and superior mesenteric artery origins. No enlarged lymph nodes. Reproductive: Thickened appearance of the endometrium. 4.3 x 2.5 cm homogeneously hyperattenuating or enhancing left adnexal mass, possibly arising from the left ovary. Grossly unremarkable right ovary. Other: No intraperitoneal free fluid.  No abdominal wall hernia. Musculoskeletal: Sacral decubitus ulcer to the right of midline contains gas and a small amount of fluid and extends to the posterior margin of the lower sacrum and coccyx without evidence of underlying osseous  erosion. Additional gas extends more superiorly posterior to the sacrum without significant fluid component. There is also a 2.2 x 1.6 cm gas and fluid collection posterior to the tip of the coccyx which is contiguous with the above described ulcer more superiorly. Surrounding soft tissue thickening and inflammatory stranding are present. More inferiorly, there is a 3.6 x 2.2 cm rim enhancing subcutaneous fluid collection in the inferior left gluteal region (series 2, image 84). Mild diffuse lumbar disc degeneration is noted. IMPRESSION: 1. Sacral decubitus ulcer with gas and fluid-filled tracts extending to the posterior sacrum and coccyx. No osseous erosion to suggest osteomyelitis. 2. 3.6 cm subcutaneous abscess in the inferior left gluteal region. 3. Large volume rectal stool. 4. Abnormal thickened appearance of the endometrium with 4.3 cm left adnexal mass. Nonemergent pelvic ultrasound is recommended for further evaluation. 5. Minimally complex 4.5 cm left renal cyst. 6.  Aortic Atherosclerosis (ICD10-I70.0). Electronically Signed   By: ALogan BoresM.D.   On: 05/10/2017 17:22   [place]kg Site Rite  Result Date: 05/16/2017 If Site Rite image not attached, placement could not be confirmed due to current cardiac rhythm.    PERTINENT LAB RESULTS: CBC: Recent Labs    05/15/17 0618 05/16/17 0623  WBC 19.0* 17.8*  HGB 9.6* 10.0*  HCT 29.2* 30.9*  PLT 424*  510*   CMET CMP     Component Value Date/Time   NA 138 05/16/2017 0623   K 3.7 05/16/2017 0623   CL 104 05/16/2017 0623   CO2 25 05/16/2017 0623   GLUCOSE 126 (H) 05/16/2017 0623   BUN 12 05/16/2017 0623   CREATININE 0.85 05/16/2017 0623   CALCIUM 8.4 (L) 05/16/2017 0623   PROT 6.9 05/11/2017 0634   ALBUMIN 2.4 (L) 05/11/2017 0634   AST 18 05/11/2017 0634   ALT 18 05/11/2017 0634   ALKPHOS 59 05/11/2017 0634   BILITOT 0.4 05/11/2017 0634   GFRNONAA >60 05/16/2017 0623   GFRAA >60 05/16/2017 0623    GFR Estimated Creatinine  Clearance: 46.5 mL/min (by C-G formula based on SCr of 0.85 mg/dL). No results for input(s): LIPASE, AMYLASE in the last 72 hours. No results for input(s): CKTOTAL, CKMB, CKMBINDEX, TROPONINI in the last 72 hours. Invalid input(s): POCBNP No results for input(s): DDIMER in the last 72 hours. No results for input(s): HGBA1C in the last 72 hours. No results for input(s): CHOL, HDL, LDLCALC, TRIG, CHOLHDL, LDLDIRECT in the last 72 hours. No results for input(s): TSH, T4TOTAL, T3FREE, THYROIDAB in the last 72 hours.  Invalid input(s): FREET3 No results for input(s): VITAMINB12, FOLATE, FERRITIN, TIBC, IRON, RETICCTPCT in the last 72 hours. Coags: No results for input(s): INR in the last 72 hours.  Invalid input(s): PT Microbiology: Recent Results (from the past 240 hour(s))  Blood culture (routine x 2)     Status: None   Collection Time: 05/10/17  4:16 PM  Result Value Ref Range Status   Specimen Description   Final    BLOOD LEFT ANTECUBITAL Performed at [facility] [address], [place], [place] [address]    Special Requests   Final    BOTTLES DRAWN AEROBIC AND ANAEROBIC Blood Culture adequate volume Performed at [facility] [address], [place], [place] [address]    Culture   Final    NO GROWTH 5 DAYS Performed at [facility], [place] [address] [place], [place] [address]    Report Status 05/15/2017 FINAL  Final  Blood culture (routine x 2)     Status: None   Collection Time: 05/10/17  4:30 PM  Result Value Ref Range Status   Specimen Description   Final    BLOOD RIGHT ANTECUBITAL Performed at [facility] [address], [place], [place] [address]    Special Requests   Final    BOTTLES DRAWN AEROBIC AND ANAEROBIC Blood Culture adequate volume Performed at [facility] [address] [place], [place] [address]    Culture   Final    NO GROWTH 5 DAYS Performed at [facility], [place] [address], [place], [place] [address]    Report Status 05/15/2017 FINAL  Final  Surgical PCR screen     Status: Abnormal   Collection Time: 05/11/17  5:59 AM  Result Value Ref Range Status   MRSA, PCR POSITIVE (A) NEGATIVE Final    Comment: RESULT CALLED TO, READ BACK BY AND VERIFIED WITH: CAUDLE,E @ 1642 ON [id number] BY POTEAT,S    Staphylococcus aureus POSITIVE (A) NEGATIVE Final    Comment: (NOTE) The Xpert SA Assay (FDA approved for NASAL specimens in patients 90 years of age and older), is one component of a comprehensive surveillance program. It is not intended to diagnose infection nor to guide or monitor treatment. Performed at [facility],  [place] [address] [place], [place] [address]   Anaerobic culture     Status: None   Collection Time: 05/11/17 11:09 AM  Result Value Ref Range Status   Specimen Description   Final    WOUND SACRAL DECUBITUS ULCER Performed at [facility] [address] [place], [place] [address]    Special Requests   Final    PATIENT ON FOLLOWING CLINDAM/GENT Performed at [facility], [place] [address] [place], [place] [address]    Culture   Final    MIXED ANAEROBIC FLORA PRESENT.  CALL LAB IF FURTHER IID REQUIRED.   Report Status 05/15/2017 FINAL  Final  Aerobic Culture (superficial specimen)     Status: None   Collection Time: 05/11/17 11:09 AM  Result Value Ref Range Status   Specimen Description   Final    WOUND SACRAL DECUB ULCER Performed at [facility] [address], [place], [place] [address]    Special Requests   Final    PATIENT ON FOLLOWING CLIND/GENT Performed at [facility] [address], [place], [place] [address]    Gram Stain   Final    RARE WBC PRESENT, PREDOMINANTLY MONONUCLEAR ABUNDANT GRAM NEGATIVE RODS ABUNDANT GRAM POSITIVE RODS ABUNDANT GRAM POSITIVE COCCI IN PAIRS AND CHAINS IN CLUSTERS    Culture   Final    FEW  ESCHERICHIA COLI Confirmed Extended Spectrum Beta-Lactamase Producer (ESBL).  In bloodstream infections from ESBL organisms, carbapenems are preferred over piperacillin/tazobactam. They are shown to have a lower risk of mortality. Performed at [facility], [place] [address] [place], [place] [address]    Report Status 05/14/2017 FINAL  Final   Organism ID, Bacteria ESCHERICHIA COLI  Final      Susceptibility   Escherichia coli - MIC*    AMPICILLIN >=32 RESISTANT Resistant     CEFAZOLIN >=64 RESISTANT Resistant     CEFEPIME RESISTANT Resistant     CEFTAZIDIME RESISTANT Resistant     CEFTRIAXONE RESISTANT Resistant     CIPROFLOXACIN >=4 RESISTANT Resistant     GENTAMICIN <=1 SENSITIVE Sensitive     IMIPENEM <=0.25 SENSITIVE Sensitive     TRIMETH/SULFA <=20 SENSITIVE Sensitive     AMPICILLIN/SULBACTAM 4 SENSITIVE Sensitive     PIP/TAZO <=4 SENSITIVE Sensitive     Extended ESBL POSITIVE Resistant     * FEW ESCHERICHIA COLI  Urine culture     Status: None   Collection Time: 05/14/17 11:38 PM  Result Value Ref Range Status   Specimen Description   Final    URINE, RANDOM Performed at [facility] [address], [place], [place] [address]    Special Requests   Final    NONE Performed at [facility], [place] [address], [place],  [address]    Culture   Final    NO GROWTH Performed at [facility], [place] [address] [place],  [address]    Report Status 05/16/2017 FINAL  Final    FURTHER DISCHARGE INSTRUCTIONS:  Get Medicines reviewed and adjusted: Please take all your medications with you for your next visit with your Primary MD  Laboratory/radiological data: Please request your Primary MD to go over all hospital tests and procedure/radiological results at the follow up, please ask your Primary MD to get all Hospital records sent to his/her office.  In some cases, they will be blood work, cultures and biopsy results  pending at the time of your  discharge. Please request that your primary care M.D. goes through all the records of your hospital data and follows up on these results.  Also Note the following: If you experience worsening of your admission symptoms, develop shortness of breath, life threatening emergency, suicidal or homicidal thoughts you must seek medical attention immediately by calling 911 or calling your MD immediately  if symptoms less severe.  You must read complete instructions/literature along with all the possible adverse reactions/side effects for all the Medicines you take and that have been prescribed to you. Take any new Medicines after you have completely understood and accpet all the possible adverse reactions/side effects.   Do not drive when taking Pain medications or sleeping medications (Benzodaizepines)  Do not take more than prescribed Pain, Sleep and Anxiety Medications. It is not advisable to combine anxiety,sleep and pain medications without talking with your primary care practitioner  Special Instructions: If you have smoked or chewed Tobacco  in the last 2 yrs please stop smoking, stop any regular Alcohol  and or any Recreational drug use.  Wear Seat belts while driving.  Please note: You were cared for by a hospitalist during your hospital stay. Once you are discharged, your primary care physician will handle any further medical issues. Please note that NO REFILLS for any discharge medications will be authorized once you are discharged, as it is imperative that you return to your primary care physician (or establish a relationship with a primary care physician if you do not have one) for your post hospital discharge needs so that they can reassess your need for medications and monitor your lab values.  Total Time spent coordinating discharge including counseling, education and face to face time equals 45 minutes.  SignedOren Binet 05/16/2017 10:42 AM

## 2017-05-16 NOTE — Progress Notes (Signed)
[facility] Ptar left with pt to transport to facility.

## 2017-05-16 NOTE — Progress Notes (Signed)
Peripherally Inserted Central Catheter/Midline Placement  The IV Nurse has discussed with the patient and/or persons authorized to consent for the patient, the purpose of this procedure and the potential benefits and risks involved with this procedure.  The benefits include less needle sticks, lab draws from the catheter, and the patient may be discharged home with the catheter. Risks include, but not limited to, infection, bleeding, blood clot (thrombus formation), and puncture of an artery; nerve damage and irregular heartbeat and possibility to perform a PICC exchange if needed/ordered by physician.  Alternatives to this procedure were also discussed.  Bard Power PICC patient education guide, fact sheet on infection prevention and patient information card has been provided to patient /or left at bedside.    PICC/Midline Placement Documentation  PICC Single Lumen 05/16/17 PICC Right Brachial 31 cm 0 cm (Active)  Indication for Insertion or Continuance of Line Home intravenous therapies (PICC only) 05/16/2017  9:15 AM  Exposed Catheter (cm) 0 cm 05/16/2017  9:15 AM  Site Assessment Clean;Dry;Intact 05/16/2017  9:15 AM  Line Status Flushed 05/16/2017  9:15 AM  Dressing Type Transparent 05/16/2017  9:15 AM  Dressing Status Clean;Intact;Dry;Antimicrobial disc in place 05/16/2017  9:15 AM  Dressing Intervention New dressing 05/16/2017  9:15 AM  Dressing Change Due 05/23/17 05/16/2017  9:15 AM       Audrie GallusByerly, [place] Ramos 05/16/2017, 9:35 AM

## 2017-05-16 NOTE — Progress Notes (Signed)
Attempted to call report to [facility]  

## 2017-05-16 NOTE — Clinical Social Work Placement (Signed)
   3:31 PM Patient and family chose bed [facility].  LCSW confirmed bed with facility. Room 101 B  Patient will transport by [facility].   LCSW faxed dc docs to facility.   LCSW notified family of transport.   RN report number: [id number]  [facility]  CLINICAL SOCIAL WORK PLACEMENT  NOTE  Date:  05/16/2017  Patient Details  Name: [name] MRN: [id number] Date of Birth: 1930-10-03  Clinical Social Work is seeking post-discharge placement for this patient at the Skilled  Nursing Facility level of care (*CSW will initial, date and re-position this form in  chart as items are completed):  Yes   Patient/family provided with [facility] Clinical Social Work Department's list of facilities offering this level of care within the geographic area requested by the patient (or if unable, by the patient's family).  Yes   Patient/family informed of their freedom to choose among providers that offer the needed level of care, that participate in Medicare, Medicaid or managed care program needed by the patient, have an available bed and are willing to accept the patient.  Yes   Patient/family informed of [facility] ownership interest in [facility] and [facility], as well as of the fact that they are under no obligation to receive care at these facilities.  PASRR submitted to EDS on       PASRR number received on 05/15/17     Existing PASRR number confirmed on       FL2 transmitted to all facilities in geographic area requested by pt/family on 05/15/17     FL2 transmitted to all facilities within larger geographic area on       Patient informed that his/her managed care company has contracts with or will negotiate with certain facilities, including the following:        Yes   Patient/family informed of bed offers received.  [facility]     Physician recommends and patient chooses bed at      [facility] 05[facility]16/[facility].  Patient to be transferred to facility by EMS     Patient family notified on 05/16/17 of transfer.  Name of family member notified:  Caroline Wallace     PHYSICIAN       Additional Comment:    _______________________________________________ Caroline HellingBernette Amarri Michaelson, LCSW 05/16/2017, 3:31 PM

## 2017-05-16 NOTE — Progress Notes (Signed)
Physical Therapy Treatment Patient Details Name: Caroline JohnsClara J Wallace MRN: [id number] DOB: Dec 18, 1930 Today's Date: 05/16/2017    History of Present Illness 84 yo female admitted with sepsis, sacral ulcer/L buttock abscess. S/P I&D+abscess drainage 3/8. Hx of dementia, CKD, osteoporosis. Pt is from an ALF    PT Comments    Progressing with mobility. Pt participated well on today. She required Min assist and increased time to complete tasks. Pt reports pain in buttocks area (s/p I&D). Will continue to follow.    Follow Up Recommendations  SNF     Equipment Recommendations  None recommended by PT    Recommendations for Other Services       Precautions / Restrictions Precautions Precautions: Fall Precaution Comments: wound on buttocks/sacrum Restrictions Weight Bearing Restrictions: No    Mobility  Bed Mobility Overal bed mobility: Needs Assistance Bed Mobility: Supine to Sit;Sit to Supine     Supine to sit: Min assist;HOB elevated Sit to supine: Min assist;HOB elevated   General bed mobility comments: Assist for trunk and LEs. Multimodal cueing required. Increased time required.   Transfers Overall transfer level: Needs assistance Equipment used: 4-wheeled [place]ace Transfers: Sit to/from Stand Sit to Stand: Min assist         General transfer comment: Assist to rise, stabilize/steady, control descent. Multimodal cueing required. Increased time.   Ambulation/Gait Ambulation/Gait assistance: Min assist Ambulation Distance (Feet): 115 Feet Assistive device: 4-wheeled [place] Gait Pattern/deviations: Step-through pattern;Decreased stride length     General Gait Details: Intermittent assist to steady. Cues for safety.    Stairs            Wheelchair Mobility    Modified Rankin (Stroke Patients Only)       Balance Overall balance assessment: Needs assistance         Standing balance support: Bilateral upper extremity supported Standing balance-Leahy  Scale: Poor                              Cognition Arousal/Alertness: Awake/alert Behavior During Therapy: WFL for tasks assessed/performed Overall Cognitive Status: History of cognitive impairments - at baseline                                        Exercises      General Comments        Pertinent Vitals/Pain Pain Assessment: Faces Faces Pain Scale: Hurts little more Pain Location: buttocks Pain Descriptors / Indicators: Sore;Discomfort Pain Intervention(s): Monitored during session;Repositioned    Home Living                      Prior Function            PT Goals (current goals can now be found in the care plan section) Progress towards PT goals: Progressing toward goals    Frequency    Min 2X/week      PT Plan Current plan remains appropriate    Co-evaluation              AM-PAC PT "6 Clicks" Daily Activity  Outcome Measure  Difficulty turning over in bed (including adjusting bedclothes, sheets and blankets)?: Unable Difficulty moving from lying on back to sitting on the side of the bed? : Unable Difficulty sitting down on and standing up from a chair with arms (e.g., wheelchair, bedside commode, etc,.)?: Unable  Help needed moving to and from a bed to chair (including a wheelchair)?: A Little Help needed walking in hospital room?: A Little Help needed climbing 3-5 steps with a railing? : Total 6 Click Score: 10    End of Session Equipment Utilized During Treatment: Gait belt Activity Tolerance: Patient tolerated treatment well Patient left: in bed;with call bell/phone within reach   PT Visit Diagnosis: Muscle weakness (generalized) (M62.81);Difficulty in walking, not elsewhere classified (R26.2);Pain     Time: 1610-9604 PT Time Calculation (min) (ACUTE ONLY): 28 min  Charges:  $Gait Training: 8-22 mins $Therapeutic Activity: 8-22 mins                    G Codes:          Rebeca Alert,  MPT Pager: [phone]

## 2017-05-16 NOTE — Discharge Instructions (Signed)
Mechanical Wound Debridement Continue wet to dry dressing changes both sites till sites are healed in.   Mechanical wound debridement is a treatment to remove dead tissue from a wound. This helps the wound heal. The treatment involves cleaning the wound (irrigation) and using a pad or gauze (dressing) to remove dead tissue and debris from the wound. There are different types of mechanical wound debridement. Depending on the wound, you may need to repeat this procedure or change to another form of debridement as your wound starts to heal. Tell a health care provider about:  Any allergies you have.  All medicines you are taking, including vitamins, herbs, eye drops, creams, and over-the-counter medicines.  Any blood disorders you have.  Any medical conditions you have, including any conditions that: ? Cause a significant decrease in blood circulation to the part of the body where the wound is, such as peripheral vascular disease. ? Compromise your defense (immune) system or white blood count.  Any surgeries you have had.  Whether you are pregnant or may be pregnant. What are the risks? Generally, this is a safe procedure. However, problems may occur, including:  Infection.  Bleeding.  Damage to healthy tissue in and around your wound.  Soreness or pain.  Failure of the wound to heal.  Scarring.  What happens before the procedure? You may be given antibiotic medicine to help prevent infection. What happens during the procedure?  Your health care provider may apply a numbing medicine (topical anesthetic) to the wound.  Your health care provider will irrigate your wound with a germ-free (sterile), salt-water (saline) solution. This removes debris, bacteria, and dead tissue.  Depending on what type of mechanical wound debridement you are having, your health care provider may do one of the following: ? Put a dressing on your wound. You may have dry gauze pad placed into the wound.  Your health care provider will remove the gauze after the wound is dry. Any dead tissue and debris that has dried into the gauze will be lifted out of the wound (wet-to-dry debridement). ? Use a type of pad (monofilament fiber debridement pad). This pad has a fluffy surface on one side that picks up dead tissue and debris from your wound. Your health care provider wets the pad and wipes it over your wound for several minutes. ? Irrigate your wound with a pressurized stream of solution such as saline or water.  Once your health care provider is finished, he or she may apply a light dressing to your wound. The procedure may vary among health care providers and hospitals. What happens after the procedure?  You may receive medicine for pain.  You will continue to receive antibiotic medicine if it was started before your procedure. This information is not intended to replace advice given to you by your health care provider. Make sure you discuss any questions you have with your health care provider. Document Released: 11/11/2014 Document Revised: 07/29/2015 Document Reviewed: 07/01/2014 Elsevier Interactive Patient Education  [employer]2018 [employer]

## 2017-05-16 NOTE — Progress Notes (Signed)
Caroline Wallace to be D/C'd Nursing Home per MD order.  Discussed prescriptions and follow up appointments with the patient. Prescriptions given to patient, medication list explained in detail. Pt verbalized understanding.  Allergies as of 05/16/2017      Reactions   Apple    Penicillins Hives      Medication List    TAKE these medications   acetaminophen 500 MG tablet Commonly known as:  TYLENOL Take 500 mg by mouth 2 (two) times daily.   cyclobenzaprine 5 MG tablet Commonly known as:  FLEXERIL Take 1 tablet (5 mg total) by mouth 2 (two) times daily as needed for muscle spasms.   donepezil 10 MG tablet Commonly known as:  ARICEPT Take 10 mg by mouth at bedtime.   ertapenem IVPB Commonly known as:  INVANZ Inject 1 g into the vein daily for 19 days. Indication:  ESBL Ecoli abscess Last Day of Therapy:  06/03/17 Labs - Once weekly:  CBC/D and BMP, Labs - Every other week:  ESR and CRP   lisinopril 10 MG tablet Commonly known as:  PRINIVIL,ZESTRIL Take 1 tablet (10 mg total) by mouth daily. What changed:    medication strength  how much to take   loratadine 10 MG tablet Commonly known as:  CLARITIN Take 1 tablet (10 mg total) by mouth daily.   memantine 5 MG tablet Commonly known as:  NAMENDA Take 5 mg by mouth daily.   ondansetron 4 MG disintegrating tablet Commonly known as:  ZOFRAN-ODT Take 4 mg by mouth every 6 (six) hours as needed for nausea.   polyethylene glycol packet Commonly known as:  MIRALAX / GLYCOLAX Take 34 g by mouth daily.   risperiDONE 0.25 MG tablet Commonly known as:  RISPERDAL Take 0.25 mg by mouth 2 (two) times daily.   trolamine salicylate 10 % cream Commonly known as:  ASPERCREME Apply 1 application topically 2 (two) times daily.   VITAMIN D-1000 MAX ST 1000 units tablet Generic drug:  Cholecalciferol Take 1,000 Units by mouth daily.            Home Infusion Instuctions  (From admission, onward)        Start     Ordered    05/16/17 0000  Home infusion instructions [facility] May follow [place] Dosing Protocol; May administer Cathflo as needed to maintain patency of vascular access device.; Flushing of vascular access device: per [facility] Protocol: 0.9% NaCl pre/post medica...    Question Answer Comment  Instructions May follow [place] Dosing Protocol   Instructions May administer Cathflo as needed to maintain patency of vascular access device.   Instructions Flushing of vascular access device: per [facility] Protocol: 0.9% NaCl pre/post medication administration and prn patency; Heparin 100 u/ml, 69m for implanted ports and Heparin 10u/ml, 526mfor all other central venous catheters.   Instructions May follow [facility] Anaphylaxis Protocol for First Dose Administration in the home: 0.9% NaCl at 25-50 ml/hr to maintain IV access for protocol meds. Epinephrine 0.3 ml IV/IM PRN and Benadryl 25-50 IV/IM PRN s/s of anaphylaxis.   Instructions [facility] Infusion Coordinator (RN) to assist per patient IV care needs in the home PRN.      05/16/17 1031      Vitals:   05/16/17 0518 05/16/17 1500  BP: 126/61 124/60  Pulse: 69 75  Resp: 20 14  Temp: 98.4 F (36.9 C) 98.8 F (37.1 C)  SpO2: 100% 97%    Skin clean, dry and intact  without evidence of skin break down, no evidence of skin tears noted. IV catheter discontinued intact. Site without signs and symptoms of complications. Dressing and pressure applied. Pt denies pain at this time. No complaints noted.  An After Visit Summary was printed and given to the patient. Patient escorted via [place], and D/C home via private auto.  Haywood Lasso BSN, RN [employer] Phone [phone]

## 2017-05-17 DIAGNOSIS — L89154 Pressure ulcer of sacral region, stage 4: Secondary | ICD-10-CM | POA: Diagnosis not present

## 2017-05-20 DIAGNOSIS — M4628 Osteomyelitis of vertebra, sacral and sacrococcygeal region: Secondary | ICD-10-CM | POA: Diagnosis not present

## 2017-05-20 DIAGNOSIS — F0390 Unspecified dementia without behavioral disturbance: Secondary | ICD-10-CM | POA: Diagnosis not present

## 2017-05-20 DIAGNOSIS — N183 Chronic kidney disease, stage 3 (moderate): Secondary | ICD-10-CM | POA: Diagnosis not present

## 2017-05-20 DIAGNOSIS — I10 Essential (primary) hypertension: Secondary | ICD-10-CM | POA: Diagnosis not present

## 2017-05-24 DIAGNOSIS — L89154 Pressure ulcer of sacral region, stage 4: Secondary | ICD-10-CM | POA: Diagnosis not present

## 2017-05-30 DIAGNOSIS — R195 Other fecal abnormalities: Secondary | ICD-10-CM | POA: Diagnosis not present

## 2017-05-30 DIAGNOSIS — F0390 Unspecified dementia without behavioral disturbance: Secondary | ICD-10-CM | POA: Diagnosis not present

## 2017-05-30 DIAGNOSIS — L89154 Pressure ulcer of sacral region, stage 4: Secondary | ICD-10-CM | POA: Diagnosis not present

## 2017-05-30 DIAGNOSIS — R509 Fever, unspecified: Secondary | ICD-10-CM | POA: Diagnosis not present

## 2017-05-31 DIAGNOSIS — L89154 Pressure ulcer of sacral region, stage 4: Secondary | ICD-10-CM | POA: Diagnosis not present

## 2017-06-05 DIAGNOSIS — L0231 Cutaneous abscess of buttock: Secondary | ICD-10-CM | POA: Diagnosis not present

## 2017-06-05 DIAGNOSIS — L89159 Pressure ulcer of sacral region, unspecified stage: Secondary | ICD-10-CM | POA: Diagnosis not present

## 2017-06-07 DIAGNOSIS — L89154 Pressure ulcer of sacral region, stage 4: Secondary | ICD-10-CM | POA: Diagnosis not present

## 2017-06-07 DIAGNOSIS — L89324 Pressure ulcer of left buttock, stage 4: Secondary | ICD-10-CM | POA: Diagnosis not present

## 2017-06-11 ENCOUNTER — Encounter (HOSPITAL_BASED_OUTPATIENT_CLINIC_OR_DEPARTMENT_OTHER): Payer: Medicare PPO

## 2017-06-12 DIAGNOSIS — A419 Sepsis, unspecified organism: Secondary | ICD-10-CM | POA: Diagnosis not present

## 2017-06-12 DIAGNOSIS — R509 Fever, unspecified: Secondary | ICD-10-CM | POA: Diagnosis not present

## 2017-06-12 DIAGNOSIS — R5381 Other malaise: Secondary | ICD-10-CM | POA: Diagnosis not present

## 2017-06-12 DIAGNOSIS — R05 Cough: Secondary | ICD-10-CM | POA: Diagnosis not present

## 2017-06-13 DIAGNOSIS — R5383 Other fatigue: Secondary | ICD-10-CM | POA: Diagnosis not present

## 2017-06-13 DIAGNOSIS — E860 Dehydration: Secondary | ICD-10-CM | POA: Diagnosis not present

## 2017-06-13 DIAGNOSIS — R509 Fever, unspecified: Secondary | ICD-10-CM | POA: Diagnosis not present

## 2017-06-13 DIAGNOSIS — F0281 Dementia in other diseases classified elsewhere with behavioral disturbance: Secondary | ICD-10-CM | POA: Diagnosis not present

## 2017-06-13 DIAGNOSIS — A419 Sepsis, unspecified organism: Secondary | ICD-10-CM | POA: Diagnosis not present

## 2017-06-13 DIAGNOSIS — J189 Pneumonia, unspecified organism: Secondary | ICD-10-CM | POA: Diagnosis not present

## 2017-06-14 DIAGNOSIS — L89154 Pressure ulcer of sacral region, stage 4: Secondary | ICD-10-CM | POA: Diagnosis not present

## 2017-06-15 DIAGNOSIS — M6281 Muscle weakness (generalized): Secondary | ICD-10-CM | POA: Diagnosis not present

## 2017-06-15 DIAGNOSIS — L89154 Pressure ulcer of sacral region, stage 4: Secondary | ICD-10-CM | POA: Diagnosis not present

## 2017-06-15 DIAGNOSIS — F0281 Dementia in other diseases classified elsewhere with behavioral disturbance: Secondary | ICD-10-CM | POA: Diagnosis not present

## 2017-06-15 DIAGNOSIS — I10 Essential (primary) hypertension: Secondary | ICD-10-CM | POA: Diagnosis not present

## 2017-06-15 DIAGNOSIS — A419 Sepsis, unspecified organism: Secondary | ICD-10-CM | POA: Diagnosis not present

## 2017-06-16 DIAGNOSIS — S161XXA Strain of muscle, fascia and tendon at neck level, initial encounter: Secondary | ICD-10-CM | POA: Diagnosis not present

## 2017-06-16 DIAGNOSIS — L89154 Pressure ulcer of sacral region, stage 4: Secondary | ICD-10-CM | POA: Diagnosis not present

## 2017-06-16 DIAGNOSIS — L89899 Pressure ulcer of other site, unspecified stage: Secondary | ICD-10-CM | POA: Diagnosis not present

## 2017-06-18 ENCOUNTER — Observation Stay (HOSPITAL_COMMUNITY): Payer: Medicare Other

## 2017-06-18 ENCOUNTER — Encounter (HOSPITAL_COMMUNITY): Payer: Self-pay | Admitting: Emergency Medicine

## 2017-06-18 ENCOUNTER — Emergency Department (HOSPITAL_COMMUNITY): Payer: Medicare Other

## 2017-06-18 ENCOUNTER — Inpatient Hospital Stay (HOSPITAL_COMMUNITY)
Admission: EM | Admit: 2017-06-18 | Discharge: 2017-06-20 | DRG: 871 | Disposition: A | Discharge status: Hospice | Payer: Medicare Other | Attending: Internal Medicine | Admitting: Internal Medicine

## 2017-06-18 DIAGNOSIS — L89154 Pressure ulcer of sacral region, stage 4: Secondary | ICD-10-CM | POA: Diagnosis present

## 2017-06-18 DIAGNOSIS — Z515 Encounter for palliative care: Secondary | ICD-10-CM | POA: Diagnosis present

## 2017-06-18 DIAGNOSIS — A419 Sepsis, unspecified organism: Secondary | ICD-10-CM | POA: Diagnosis not present

## 2017-06-18 DIAGNOSIS — G309 Alzheimer's disease, unspecified: Secondary | ICD-10-CM | POA: Diagnosis not present

## 2017-06-18 DIAGNOSIS — E876 Hypokalemia: Secondary | ICD-10-CM

## 2017-06-18 DIAGNOSIS — F0280 Dementia in other diseases classified elsewhere without behavioral disturbance: Secondary | ICD-10-CM | POA: Diagnosis present

## 2017-06-18 DIAGNOSIS — N183 Chronic kidney disease, stage 3 (moderate): Secondary | ICD-10-CM | POA: Diagnosis present

## 2017-06-18 DIAGNOSIS — E1122 Type 2 diabetes mellitus with diabetic chronic kidney disease: Secondary | ICD-10-CM | POA: Diagnosis not present

## 2017-06-18 DIAGNOSIS — L089 Local infection of the skin and subcutaneous tissue, unspecified: Secondary | ICD-10-CM | POA: Diagnosis not present

## 2017-06-18 DIAGNOSIS — Z66 Do not resuscitate: Secondary | ICD-10-CM | POA: Diagnosis not present

## 2017-06-18 DIAGNOSIS — I129 Hypertensive chronic kidney disease with stage 1 through stage 4 chronic kidney disease, or unspecified chronic kidney disease: Secondary | ICD-10-CM | POA: Diagnosis present

## 2017-06-18 DIAGNOSIS — R4182 Altered mental status, unspecified: Secondary | ICD-10-CM | POA: Diagnosis not present

## 2017-06-18 DIAGNOSIS — D649 Anemia, unspecified: Secondary | ICD-10-CM

## 2017-06-18 DIAGNOSIS — L8992 Pressure ulcer of unspecified site, stage 2: Secondary | ICD-10-CM | POA: Diagnosis not present

## 2017-06-18 DIAGNOSIS — L8994 Pressure ulcer of unspecified site, stage 4: Secondary | ICD-10-CM | POA: Diagnosis not present

## 2017-06-18 DIAGNOSIS — R509 Fever, unspecified: Secondary | ICD-10-CM | POA: Diagnosis not present

## 2017-06-18 LAB — COMPREHENSIVE METABOLIC PANEL
ALBUMIN: 2.1 g/dL — AB (ref 3.5–5.0)
ALT: 15 U/L (ref 14–54)
ANION GAP: 11 (ref 5–15)
AST: 16 U/L (ref 15–41)
Alkaline Phosphatase: 46 U/L (ref 38–126)
BILIRUBIN TOTAL: 0.4 mg/dL (ref 0.3–1.2)
BUN: 12 mg/dL (ref 6–20)
CO2: 27 mmol/L (ref 22–32)
Calcium: 8.1 mg/dL — ABNORMAL LOW (ref 8.9–10.3)
Chloride: 106 mmol/L (ref 101–111)
Creatinine, Ser: 1.02 mg/dL — ABNORMAL HIGH (ref 0.44–1.00)
GFR calc non Af Amer: 49 mL/min — ABNORMAL LOW (ref >60)
GFR, EST AFRICAN AMERICAN: 56 mL/min — AB (ref >60)
GLUCOSE: 106 mg/dL — AB (ref 65–99)
POTASSIUM: 2.7 mmol/L — AB (ref 3.5–5.1)
Sodium: 144 mmol/L (ref 135–145)
TOTAL PROTEIN: 6.1 g/dL — AB (ref 6.5–8.1)

## 2017-06-18 LAB — I-STAT CG4 LACTIC ACID, ED
LACTIC ACID, VENOUS: 0.73 mmol/L (ref 0.5–1.9)
Lactic Acid, Venous: 0.57 mmol/L (ref 0.5–1.9)

## 2017-06-18 LAB — CBC WITH DIFFERENTIAL/PLATELET
BASOS ABS: 0 10*3/uL (ref 0.0–0.1)
Basophils Relative: 0 %
EOS ABS: 0.5 10*3/uL (ref 0.0–0.7)
Eosinophils Relative: 3 %
HEMATOCRIT: 23.9 % — AB (ref 36.0–46.0)
HEMOGLOBIN: 7.8 g/dL — AB (ref 12.0–15.0)
Lymphocytes Relative: 8 %
Lymphs Abs: 1.3 10*3/uL (ref 0.7–4.0)
MCH: 28.9 pg (ref 26.0–34.0)
MCHC: 32.6 g/dL (ref 30.0–36.0)
MCV: 88.5 fL (ref 78.0–100.0)
MONOS PCT: 8 %
Monocytes Absolute: 1.3 10*3/uL — ABNORMAL HIGH (ref 0.1–1.0)
NEUTROS ABS: 12.6 10*3/uL — AB (ref 1.7–7.7)
NEUTROS PCT: 81 %
Platelets: 501 10*3/uL — ABNORMAL HIGH (ref 150–400)
RBC: 2.7 MIL/uL — AB (ref 3.87–5.11)
RDW: 15.3 % (ref 11.5–15.5)
WBC: 15.6 10*3/uL — AB (ref 4.0–10.5)

## 2017-06-18 LAB — TYPE AND SCREEN
ABO/RH(D): AB POS
Antibody Screen: NEGATIVE

## 2017-06-18 LAB — ABO/RH: ABO/RH(D): AB POS

## 2017-06-18 LAB — PROTIME-INR
INR: 1.12
PROTHROMBIN TIME: 14.3 s (ref 11.4–15.2)

## 2017-06-18 LAB — MAGNESIUM: MAGNESIUM: 1.7 mg/dL (ref 1.7–2.4)

## 2017-06-18 MED ORDER — SODIUM CHLORIDE 0.9 % IV BOLUS (SEPSIS)
1000.0000 mL | Freq: Once | INTRAVENOUS | Status: AC
  Administered 2017-06-18: 1000 mL via INTRAVENOUS

## 2017-06-18 MED ORDER — POTASSIUM CHLORIDE 10 MEQ/100ML IV SOLN
10.0000 meq | INTRAVENOUS | Status: AC
  Administered 2017-06-18 (×2): 10 meq via INTRAVENOUS
  Filled 2017-06-18 (×2): qty 100

## 2017-06-18 MED ORDER — LORATADINE 10 MG PO TABS
10.0000 mg | ORAL_TABLET | Freq: Every day | ORAL | Status: DC
  Administered 2017-06-18 – 2017-06-20 (×3): 10 mg via ORAL
  Filled 2017-06-18 (×3): qty 1

## 2017-06-18 MED ORDER — SODIUM CHLORIDE 0.9 % IV SOLN
1.0000 g | Freq: Three times a day (TID) | INTRAVENOUS | Status: DC
  Filled 2017-06-18 (×2): qty 1

## 2017-06-18 MED ORDER — POTASSIUM CHLORIDE CRYS ER 20 MEQ PO TBCR: 40.0000 meq | EXTENDED_RELEASE_TABLET | Freq: Once | ORAL | Status: DC

## 2017-06-18 MED ORDER — POLYETHYLENE GLYCOL 3350 17 G PO PACK
34.0000 g | PACK | Freq: Every day | ORAL | Status: DC
  Administered 2017-06-19: 34 g via ORAL
  Filled 2017-06-18 (×2): qty 2

## 2017-06-18 MED ORDER — DONEPEZIL HCL 10 MG PO TABS
10.0000 mg | ORAL_TABLET | Freq: Every day | ORAL | Status: DC
  Administered 2017-06-18: 10 mg via ORAL
  Filled 2017-06-18: qty 1

## 2017-06-18 MED ORDER — MEMANTINE HCL 10 MG PO TABS
5.0000 mg | ORAL_TABLET | Freq: Every day | ORAL | Status: DC
  Administered 2017-06-19: 5 mg via ORAL
  Filled 2017-06-18 (×2): qty 1

## 2017-06-18 MED ORDER — POTASSIUM CHLORIDE CRYS ER 20 MEQ PO TBCR
40.0000 meq | EXTENDED_RELEASE_TABLET | Freq: Once | ORAL | Status: AC
  Administered 2017-06-18: 40 meq via ORAL
  Filled 2017-06-18: qty 2

## 2017-06-18 MED ORDER — VANCOMYCIN HCL IN DEXTROSE 1-5 GM/200ML-% IV SOLN: 1000.0000 mg | INTRAVENOUS | Status: DC

## 2017-06-18 MED ORDER — CYCLOBENZAPRINE HCL 5 MG PO TABS: 5.0000 mg | ORAL_TABLET | Freq: Two times a day (BID) | ORAL | Status: DC | PRN

## 2017-06-18 MED ORDER — SODIUM CHLORIDE 0.9 % IV SOLN
2.0000 g | Freq: Once | INTRAVENOUS | Status: DC
  Filled 2017-06-18: qty 2

## 2017-06-18 MED ORDER — ONDANSETRON 4 MG PO TBDP: 4.0000 mg | ORAL_TABLET | Freq: Four times a day (QID) | ORAL | Status: DC | PRN

## 2017-06-18 MED ORDER — VITAMIN D 1000 UNITS PO TABS
1000.0000 [IU] | ORAL_TABLET | Freq: Every day | ORAL | Status: DC
  Administered 2017-06-18 – 2017-06-19 (×2): 1000 [IU] via ORAL
  Filled 2017-06-18 (×3): qty 1

## 2017-06-18 MED ORDER — POTASSIUM CHLORIDE 20 MEQ/15ML (10%) PO SOLN
40.0000 meq | Freq: Once | ORAL | Status: DC
  Filled 2017-06-18 (×2): qty 30

## 2017-06-18 MED ORDER — AZTREONAM IN DEXTROSE 1 GM/50ML IV SOLN
1.0000 g | Freq: Three times a day (TID) | INTRAVENOUS | Status: DC
  Administered 2017-06-18 – 2017-06-19 (×3): 1 g via INTRAVENOUS
  Filled 2017-06-18 (×4): qty 50

## 2017-06-18 MED ORDER — ACETAMINOPHEN 500 MG PO TABS: 500.0000 mg | ORAL_TABLET | Freq: Two times a day (BID) | ORAL | Status: DC

## 2017-06-18 MED ORDER — AZTREONAM IN DEXTROSE 2 GM/50ML IV SOLN
2.0000 g | Freq: Once | INTRAVENOUS | Status: AC
  Administered 2017-06-18: 2 g via INTRAVENOUS
  Filled 2017-06-18: qty 50

## 2017-06-18 MED ORDER — LACTATED RINGERS IV SOLN
INTRAVENOUS | Status: AC
  Administered 2017-06-18: 20:00:00 via INTRAVENOUS

## 2017-06-18 MED ORDER — METRONIDAZOLE IN NACL 5-0.79 MG/ML-% IV SOLN
500.0000 mg | Freq: Three times a day (TID) | INTRAVENOUS | Status: DC
  Administered 2017-06-18 – 2017-06-19 (×3): 500 mg via INTRAVENOUS
  Filled 2017-06-18 (×3): qty 100

## 2017-06-18 MED ORDER — RISPERIDONE 0.25 MG PO TABS
0.2500 mg | ORAL_TABLET | Freq: Two times a day (BID) | ORAL | Status: DC
  Administered 2017-06-18 – 2017-06-20 (×4): 0.25 mg via ORAL
  Filled 2017-06-18 (×4): qty 1

## 2017-06-18 MED ORDER — VANCOMYCIN HCL IN DEXTROSE 1-5 GM/200ML-% IV SOLN
1000.0000 mg | Freq: Once | INTRAVENOUS | Status: AC
  Administered 2017-06-18: 1000 mg via INTRAVENOUS
  Filled 2017-06-18: qty 200

## 2017-06-18 MED ORDER — ACETAMINOPHEN 160 MG/5ML PO SOLN
500.0000 mg | Freq: Two times a day (BID) | ORAL | Status: DC
  Administered 2017-06-18 – 2017-06-20 (×4): 500 mg via ORAL
  Filled 2017-06-18 (×4): qty 20.3

## 2017-06-18 MED ORDER — METRONIDAZOLE IN NACL 5-0.79 MG/ML-% IV SOLN
500.0000 mg | Freq: Once | INTRAVENOUS | Status: AC
  Administered 2017-06-18: 500 mg via INTRAVENOUS
  Filled 2017-06-18: qty 100

## 2017-06-18 NOTE — ED Triage Notes (Signed)
Per PT[facility], patient was a [facility] and she was sent to [facility] on Friday-states she has a wound on her butocks-stated they can not help here so they sent her to ED for eval-BP 116/50, HR 92 CBG 106

## 2017-06-18 NOTE — ED Notes (Signed)
Bed: WA21 Expected date:  Expected time:  Means of arrival:  Comments: Ems-[id number] Y/O

## 2017-06-18 NOTE — ED Provider Notes (Signed)
[facility]-EMERGENCY DEPT Provider Note   CSN: [id number] Arrival date & time: 06/18/17  1253     History   Chief Complaint Chief Complaint  Patient presents with  . Wound Check    HPI Caroline Wallace is a 84 y.o. female.  HPI Patient presented to the emergency room for evaluation of fever and a wound on her buttocks.  Patient has history of dementia.  She resides at a nursing facility.  When he to the notes the patient was at [facility] health care and was sent to [facility] on Friday.  The patient was noted to have a fever today.  There was some question of a decline in her mental status.  In the emergency room the patient tells me she is having pain in her back.  She describes the area of her large decubitus ulcer.  Patient denies any abdominal pain.  No headache.  No vomiting or diarrhea. Past Medical History:  Diagnosis Date  . Allergy   . Altered mental status 2017  . CKD (chronic kidney disease), stage II    GFR 60s  . Dementia 2017  . Diabetes mellitus without complication (HCC)   . Hypertension   . Osteoporosis   . Vitamin D deficiency     Patient Active Problem List   Diagnosis Date Noted  . Moderate protein-calorie malnutrition (HCC)   . Sepsis (HCC) 05/10/2017  . Sacral decubitus ulcer, stage IV (HCC) 05/10/2017  . Abscess 05/10/2017  . Leucocytosis 05/10/2017  . Elevated lactic acid level 05/10/2017  . Constipation, chronic 05/10/2017  . Elevated hemoglobin A1c 03/29/2016  . Sundowning 03/28/2016  . Nocturnal wandering 12/21/2015  . Vitamin D deficiency 12/03/2015  . CKD (chronic kidney disease) stage 3, GFR 30-59 ml/min (HCC)   . Essential hypertension   . Osteoporosis   . Alzheimer's dementia with behavioral disturbance 03/07/2015    Past Surgical History:  Procedure Laterality Date  . IRRIGATION AND DEBRIDEMENT ABSCESS N/A 05/11/2017   Procedure: IRRIGATION AND DEBRIDEMENT OF SACRAL DECUBUTIS ULCER ;  Surgeon: Almond Lint,  MD;  Location: [facility];  Service: General;  Laterality: N/A;     OB History   None      Home Medications    Prior to Admission medications   Medication Sig Start Date End Date Taking? Authorizing Provider  acetaminophen (TYLENOL) 500 MG tablet Take 500 mg by mouth 2 (two) times daily.   Yes [provider]  Cholecalciferol (VITAMIN D-1000 MAX ST) 1000 units tablet Take 1,000 Units by mouth daily.   Yes [provider]  cyclobenzaprine (FLEXERIL) 5 MG tablet Take 1 tablet (5 mg total) by mouth 2 (two) times daily as needed for muscle spasms. 11/02/16  Yes Alvira Monday, MD  donepezil (ARICEPT) 10 MG tablet Take 10 mg by mouth at bedtime.   Yes [provider]  lisinopril (PRINIVIL,ZESTRIL) 10 MG tablet Take 1 tablet (10 mg total) by mouth daily. 05/16/17  Yes Ghimire, Werner Lean, MD  loratadine (CLARITIN) 10 MG tablet Take 1 tablet (10 mg total) by mouth daily. 11/25/15  Yes Kuneff, Renee A, DO  memantine (NAMENDA) 5 MG tablet Take 5 mg by mouth daily.   Yes [provider]  ondansetron (ZOFRAN-ODT) 4 MG disintegrating tablet Take 4 mg by mouth every 6 (six) hours as needed for nausea. 02/26/17  Yes [provider]  polyethylene glycol (MIRALAX / GLYCOLAX) packet Take 34 g by mouth daily. 05/16/17  Yes Ghimire, Werner Lean, MD  risperiDONE (RISPERDAL) 0.25 MG tablet Take 0.25 mg by mouth 2 (two) times daily.   Yes [provider]  trolamine salicylate (ASPERCREME) 10 % cream Apply 1 application topically 2 (two) times daily.   Yes [provider]    Family History Family History  Problem Relation Age of Onset  . Lung cancer Mother     Social History Social History   Tobacco Use  . Smoking status: Never Smoker  . Smokeless tobacco: Never Used  Substance Use Topics  . Alcohol use: No  . Drug use: No     Allergies   Apple and Penicillins   Review of Systems Review of Systems  All other systems reviewed and are  negative.    Physical Exam Updated Vital Signs BP 110/61   Pulse 79   Temp (!) 100.4 F (38 C) (Rectal)   Resp 16   Ht 1.626 m (5\' 4" )   Wt 63.5 kg (140 lb)   SpO2 99%   BMI 24.03 kg/m   Physical Exam  HENT:  Head: Normocephalic and atraumatic.  Right Ear: External ear normal.  Left Ear: External ear normal.  Eyes: Conjunctivae are normal. Right eye exhibits no discharge. Left eye exhibits no discharge. No scleral icterus.  Neck: Neck supple. No tracheal deviation present.  Cardiovascular: Normal rate, regular rhythm and intact distal pulses.  Pulmonary/Chest: Effort normal and breath sounds normal. No stridor. No respiratory distress. She has no wheezes. She has no rales.  Abdominal: Soft. Bowel sounds are normal. She exhibits no distension. There is no tenderness. There is no rebound and no guarding.  Musculoskeletal: She exhibits no edema or tenderness.  Large decubitus ulcer in the sacrum, mucopurulent drainage, most likely stage IV.  No lymphangitic streaking  Neurological: She is alert. No cranial nerve deficit (no facial droop, extraocular movements intact, no slurred speech) or sensory deficit. She exhibits normal muscle tone. She displays no seizure activity. Coordination normal.  Patient answers questions appropriately, she moves all extremities when asked  Skin: Skin is warm and dry. No rash noted. She is not diaphoretic.  Psychiatric: She has a normal mood and affect.  Nursing note and vitals reviewed.    ED Treatments / Results  Labs (all labs ordered are listed, but only abnormal results are displayed) Labs Reviewed  COMPREHENSIVE METABOLIC PANEL - Abnormal; Notable for the following components:      Result Value   Potassium 2.7 (*)    Glucose, Bld 106 (*)    Creatinine, Ser 1.02 (*)    Calcium 8.1 (*)    Total Protein 6.1 (*)    Albumin 2.1 (*)    GFR calc non Af Amer 49 (*)    GFR calc Af Amer 56 (*)    All other components within normal limits  CBC  WITH DIFFERENTIAL/PLATELET - Abnormal; Notable for the following components:   WBC 15.6 (*)    RBC 2.70 (*)    Hemoglobin 7.8 (*)    HCT 23.9 (*)    Platelets 501 (*)    Neutro Abs 12.6 (*)    Monocytes Absolute 1.3 (*)    All other components within normal limits  CULTURE, BLOOD (ROUTINE X 2)  CULTURE, BLOOD (ROUTINE X 2)  URINE CULTURE  PROTIME-INR  URINALYSIS, ROUTINE W REFLEX MICROSCOPIC  I-STAT CG4 LACTIC ACID, ED  I-STAT CG4 LACTIC ACID, ED  I-STAT CG4 LACTIC ACID, ED  POC OCCULT BLOOD, ED  TYPE AND SCREEN     Radiology Dg  Chest Port 1 View  Result Date: 06/18/2017 CLINICAL DATA:  84 year old female with fever and decubitus wound. EXAM: PORTABLE CHEST 1 VIEW COMPARISON:  Chest radiographs 12/21/2015. FINDINGS: Portable AP semi upright view at 1408 hours. Lower lung volumes. Superimposed stable mild elevation of the right hemidiaphragm. Stable cardiac size and mediastinal contours. Borderline to mild cardiomegaly. Visualized tracheal air column is within normal limits. Allowing for portable technique the lungs are clear. No pneumothorax. Stable visualized osseous structures. IMPRESSION: Low lung volumes, otherwise no acute cardiopulmonary abnormality. Electronically Signed   By: Odessa Fleming M.D.   On: 06/18/2017 14:27    Procedures .Critical Care Performed by: Linwood Dibbles, MD Authorized by: Linwood Dibbles, MD   Critical care provider statement:    Critical care time (minutes):  35   Critical care was time spent personally by me on the following activities:  Discussions with consultants, evaluation of patient's response to treatment, examination of patient, ordering and performing treatments and interventions, ordering and review of laboratory studies, ordering and review of radiographic studies, pulse oximetry, re-evaluation of patient's condition, obtaining history from patient or surrogate and review of old charts   (including critical care time)  Medications Ordered in  ED Medications  sodium chloride 0.9 % bolus 1,000 mL (has no administration in time range)    And  sodium chloride 0.9 % bolus 1,000 mL (has no administration in time range)  potassium chloride 10 mEq in 100 mL IVPB (has no administration in time range)  metroNIDAZOLE (FLAGYL) IVPB 500 mg (0 mg Intravenous Stopped 06/18/17 1551)  vancomycin (VANCOCIN) IVPB 1000 mg/200 mL premix (0 mg Intravenous Stopped 06/18/17 1553)  aztreonam (AZACTAM) 2 GM IVPB (0 g Intravenous Stopped 06/18/17 1447)     Initial Impression / Assessment and Plan / ED Course  I have reviewed the triage vital signs and the nursing notes.  Pertinent labs & imaging results that were available during my care of the patient were reviewed by me and considered in my medical decision making (see chart for details).  Clinical Course as of Jun 18 1613  Mon Jun 18, 2017  1609 Hgb decreased from previous.  No signs of active bleeding.  Could be from her wound.  Will need to guiac stools  CBC with Differential(!) [JK]  1610 Low  Potassium(!!): 2.7 [JK]    Clinical Course User Index [JK] Linwood Dibbles, MD    Patient presented to the emergency room for evaluation of fever and suspected infected decubitus ulcer.  Patient's decubitus ulcer does appear to have some purulent drainage.  There is a large stage IV ulcer.  Patient does have a low-grade temperature.  Laboratory tests are notable for anemia although she does not appear to have any active bleeding.  She does have hypokalemia as well.  No signs of any lactic acidosis.  I doubt severe sepsis.  She is vital signs have improved with IV fluid hydration.  She has been started on empiric antibiotics.  I will consult with the medical service for admission and further treatment.  Final Clinical Impressions(s) / ED Diagnoses   Final diagnoses:  Infected decubitus ulcer, stage IV (HCC)  Hypokalemia  Anemia, unspecified type      Linwood Dibbles, MD 06/18/17 1615

## 2017-06-18 NOTE — Progress Notes (Signed)
Pharmacy Antibiotic Note  Caroline Wallace is a 84 y.o. female admitted on 06/18/2017 with wound infection of a large decubitus ulcer.  In the ED, patient received Vancomycin 1gm, Aztreonam 2gm and Flagyl 500mg  IV x 1 dose each.  Upon admission, MD has continued Flagyl and pharmacy has been consulted for Vancomycin and Aztreonam dosing.  Plan:  Vancomycin 1gm IV q36h (Goal AUC 400-500)  Aztreonam 1gm IV q8h  Flagyl per MD  F/U cultures/sensitivities, follow renal function  Check vancomycin levels when appropriate  Of note, patient with PCN allergy (hives); patient according to Epic history has tolerated Cefepime in the past (3/7-3/10/19)  Height: 5\' 4"  (162.6 cm) Weight: 140 lb (63.5 kg) IBW/kg (Calculated) : 54.7  Temp (24hrs), Avg:100.3 F (37.9 C), Min:99.9 F (37.7 C), Max:100.8 F (38.2 C)  Recent Labs  Lab 06/18/17 1351 06/18/17 1353 06/18/17 1626  WBC 15.6*  --   --   CREATININE 1.02*  --   --   LATICACIDVEN  --  0.73 0.57    Estimated Creatinine Clearance: 34.8 mL/min (A) (by C-G formula based on SCr of 1.02 mg/dL (H)).    Allergies  Allergen Reactions  . Apple   . Penicillins Hives    Antimicrobials this admission: 4/15 vanc >>   4/15 aztreonam >>   4/15 flagyl >>  Dose adjustments this admission:    Microbiology results: 4/15 BCx: sent 4/15 UCx: sent   Thank you for allowing pharmacy to be a part of this patient's care.  Maryellen PilePoindexter, Ilamae Geng Trefz, PharmD 06/18/2017 7:20 PM

## 2017-06-18 NOTE — H&P (Signed)
History and Physical    Caroline Wallace ZOX:[id number] DOB: Jul 22, 1930 DOA: 06/18/2017  PCP: System, Provider Not In  Patient coming from: [facility]  I have personally briefly reviewed patient's old medical records in [facility]  Chief Complaint: fever  HPI: Caroline Wallace is Caroline Wallace 84 y.o. female with medical history significant of dementia, hypertension, diabetes, infected sacral decubitus ulcer presenting with fever, elevated white blood cell count likely secondary to infected sacral decubitus ulcer.  The patient was unable to contribute to her history due to her dementia.  She denies any pain or discomfort.  The only thing that she can tell me is that she is at the hospital.  On discussion with her goddaughter, she notes that she was recently at [facility] and was transferred to [facility] on Friday.  Per her Goddaughter, she was supposed to be seen by hospice when they evaluated her they said that she was not appropriate for home hospice,.  For this reason she was sent back to the hospital.  ED Course: Labs, CXR, IVF, abx.  Hospitalists to admit for infected sacral decubitus ulcer.   Review of Systems: Limited due to patient's mental status  Past Medical History:  Diagnosis Date  . Allergy   . Altered mental status 2017  . CKD (chronic kidney disease), stage II    GFR 60s  . Dementia 2017  . Diabetes mellitus without complication (HCC)   . Hypertension   . Osteoporosis   . Vitamin D deficiency     Past Surgical History:  Procedure Laterality Date  . IRRIGATION AND DEBRIDEMENT ABSCESS N/Jazira Maloney 05/11/2017   Procedure: IRRIGATION AND DEBRIDEMENT OF SACRAL DECUBUTIS ULCER ;  Surgeon: Almond Lint, MD;  Location: [facility];  Service: General;  Laterality: N/Almer Bushey;     reports that she has never smoked. She has never used smokeless tobacco. She reports that she does not drink alcohol or use drugs.  Allergies  Allergen Reactions  . Apple     . Penicillins Hives    Family History  Problem Relation Age of Onset  . Lung cancer Mother    Prior to Admission medications   Medication Sig Start Date End Date Taking? Authorizing Provider  acetaminophen (TYLENOL) 500 MG tablet Take 500 mg by mouth 2 (two) times daily.   Yes [provider]  Cholecalciferol (VITAMIN D-1000 MAX ST) 1000 units tablet Take 1,000 Units by mouth daily.   Yes [provider]  cyclobenzaprine (FLEXERIL) 5 MG tablet Take 1 tablet (5 mg total) by mouth 2 (two) times daily as needed for muscle spasms. 11/02/16  Yes Alvira Monday, MD  donepezil (ARICEPT) 10 MG tablet Take 10 mg by mouth at bedtime.   Yes [provider]  lisinopril (PRINIVIL,ZESTRIL) 10 MG tablet Take 1 tablet (10 mg total) by mouth daily. 05/16/17  Yes Ghimire, Werner Lean, MD  loratadine (CLARITIN) 10 MG tablet Take 1 tablet (10 mg total) by mouth daily. 11/25/15  Yes Kuneff, Renee Toriano Aikey, DO  memantine (NAMENDA) 5 MG tablet Take 5 mg by mouth daily.   Yes [provider]  ondansetron (ZOFRAN-ODT) 4 MG disintegrating tablet Take 4 mg by mouth every 6 (six) hours as needed for nausea. 02/26/17  Yes [provider]  polyethylene glycol (MIRALAX / GLYCOLAX) packet Take 34 g by mouth daily. 05/16/17  Yes Ghimire, Werner Lean, MD  risperiDONE (RISPERDAL) 0.25 MG tablet Take 0.25 mg by mouth 2 (two) times daily.  Yes [provider]  trolamine salicylate (ASPERCREME) 10 % cream Apply 1 application topically 2 (two) times daily.   Yes [provider]    Physical Exam: Vitals:   06/18/17 1800 06/18/17 1815 06/18/17 1830 06/18/17 1903  BP: 108/61  117/63 (!) 112/58  Pulse: 84 81 84 80  Resp:   16 16  Temp:   99.9 F (37.7 C) 100 F (37.8 C)  TempSrc:   Oral Oral  SpO2: 100% 100% 100% 99%  Weight:      Height:        Constitutional: NAD, calm, comfortable Vitals:   06/18/17 1800 06/18/17 1815 06/18/17 1830 06/18/17 1903  BP: 108/61   117/63 (!) 112/58  Pulse: 84 81 84 80  Resp:   16 16  Temp:   99.9 F (37.7 C) 100 F (37.8 C)  TempSrc:   Oral Oral  SpO2: 100% 100% 100% 99%  Weight:      Height:       Eyes: PERRL, lids and conjunctivae normal ENMT: Mucous membranes are moist. Posterior pharynx clear of any exudate or lesions.Normal dentition.  Neck: normal, supple, no masses, no thyromegaly Respiratory: clear to auscultation bilaterally, no wheezing, no crackles. Normal respiratory effort. No accessory muscle use.  Cardiovascular: Regular rate and rhythm, no murmurs / rubs / gallops. Bilateral LEE. 2+ pedal pulses. No carotid bruits.  Abdomen: no tenderness, no masses palpated. No hepatosplenomegaly. Bowel sounds positive.  Musculoskeletal: no clubbing / cyanosis. No joint deformity upper and lower extremities. Good ROM, no contractures. Normal muscle tone.  Skin: stage IV sacral decubitus ulcer with purulent drainage Neurologic: CN 2-12 intact. Sensation intact. Moves all extremities, but does not consistently follow commands.  Psychiatric: Normal judgment and insight. Alert and oriented x 1. Normal mood.   Labs on Admission: I have personally reviewed following labs and imaging studies  CBC: Recent Labs  Lab 06/18/17 1351  WBC 15.6*  NEUTROABS 12.6*  HGB 7.8*  HCT 23.9*  MCV 88.5  PLT 501*   Basic Metabolic Panel: Recent Labs  Lab 06/18/17 1351  NA 144  K 2.7*  CL 106  CO2 27  GLUCOSE 106*  BUN 12  CREATININE 1.02*  CALCIUM 8.1*  MG 1.7   GFR: Estimated Creatinine Clearance: 34.8 mL/min (Khristopher Kapaun) (by C-G formula based on SCr of 1.02 mg/dL (H)). Liver Function Tests: Recent Labs  Lab 06/18/17 1351  AST 16  ALT 15  ALKPHOS 46  BILITOT 0.4  PROT 6.1*  ALBUMIN 2.1*   No results for input(s): LIPASE, AMYLASE in the last 168 hours. No results for input(s): AMMONIA in the last 168 hours. Coagulation Profile: Recent Labs  Lab 06/18/17 1351  INR 1.12   Cardiac Enzymes: No results for  input(s): CKTOTAL, CKMB, CKMBINDEX, TROPONINI in the last 168 hours. BNP (last 3 results) No results for input(s): PROBNP in the last 8760 hours. HbA1C: No results for input(s): HGBA1C in the last 72 hours. CBG: No results for input(s): GLUCAP in the last 168 hours. Lipid Profile: No results for input(s): CHOL, HDL, LDLCALC, TRIG, CHOLHDL, LDLDIRECT in the last 72 hours. Thyroid Function Tests: No results for input(s): TSH, T4TOTAL, FREET4, T3FREE, THYROIDAB in the last 72 hours. Anemia Panel: No results for input(s): VITAMINB12, FOLATE, FERRITIN, TIBC, IRON, RETICCTPCT in the last 72 hours. Urine analysis:    Component Value Date/Time   COLORURINE YELLOW 05/14/2017 2338   APPEARANCEUR HAZY (Genecis Veley) 05/14/2017 2338   LABSPEC 1.017 05/14/2017 2338   PHURINE 5.0  05/14/2017 2338   GLUCOSEU NEGATIVE 05/14/2017 2338   HGBUR SMALL (Cheridan Kibler) 05/14/2017 2338   BILIRUBINUR NEGATIVE 05/14/2017 2338   KETONESUR 5 (Orley Lawry) 05/14/2017 2338   PROTEINUR NEGATIVE 05/14/2017 2338   NITRITE NEGATIVE 05/14/2017 2338   LEUKOCYTESUR TRACE (Yuepheng Schaller) 05/14/2017 2338    Radiological Exams on Admission: Dg Chest Port 1 View  Result Date: 06/18/2017 CLINICAL DATA:  84 year old female with fever and decubitus wound. EXAM: PORTABLE CHEST 1 VIEW COMPARISON:  Chest radiographs 12/21/2015. FINDINGS: Portable AP semi upright view at 1408 hours. Lower lung volumes. Superimposed stable mild elevation of the right hemidiaphragm. Stable cardiac size and mediastinal contours. Borderline to mild cardiomegaly. Visualized tracheal air column is within normal limits. Allowing for portable technique the lungs are clear. No pneumothorax. Stable visualized osseous structures. IMPRESSION: Low lung volumes, otherwise no acute cardiopulmonary abnormality. Electronically Signed   By: Odessa FlemingH  Hall M.D.   On: 06/18/2017 14:27    EKG: Independently reviewed. Sinus rhythm.  No priors.  Assessment/Plan Active Problems:   Sepsis (HCC)  Goals of Care:  Pt  had recently been sent from SNF to ALF with recommendation for hospice c/s.  On arrival, home hospice nurses noted this (her wound) was not something they'd be able to manage in that environment.  This seems to be one of the major reasons she was sent back.  Discussed with Mrs. Shear's POA, Mrs. Lanier ClamKathy Mahabir, who confirms she's Earland Reish DNR.  Will plan for palliative care consult tomorrow with goals of care discussion.  May consider inpatient hospice.  From my discussion with Mrs. Mahabir, hospitalization is something she'd like to avoid in the future for her Godmother.  For now, will continue abx until GOC discussion tomorrow.  May ultimately end up d/c abx with plan to transition to comfort based on discussion.  Sepsis 2/2 sacral decubitus ulcer:  Fever, elevated WBC.  Was on levaquin at facility.  Currently receiving broad spectrum abx.  She was recently discharged 3/13 for this.  At that time, she was seen by ID and had debridement by gen surgery.  She was discharged on ivanz. Follow blood cultures Follow urinalysis IVF Abx Further management pending goals of care discussion  Hypokalemia: follow mag, replete  Anemia: lower than previously noted, but seems to be around baseline  Dementia: aricept/namenda.  Delirium precautions. Risperdal  HTN: hold lisinopril  CKD stage III: stable  DVT prophylaxis: SCD  Code Status: DNR  Family Communication: God daughter  Disposition Plan: pending GOC discussion  Consults called: palliative care c/s  Admission status: obs    Lacretia Nicksaldwell Powell MD [facility] Pager [phone]  If 7PM-7AM, please contact night-coverage [email or web address] Password [email or web address]  06/18/2017, 7:35 PM

## 2017-06-18 NOTE — ED Notes (Signed)
PHARMACY PLEASE CALL [facility] TO VERIFY UPDATED MEDICATION LIST. LIST AT BEDSIDE IS OLD LIST.

## 2017-06-18 NOTE — ED Notes (Signed)
ED TO INPATIENT HANDOFF REPORT  Name/Age/Gender Caroline Wallace 84 y.o. female  Code Status Code Status History    Date Active Date Inactive Code Status Order ID Comments User Context   05/11/2017 1500 05/16/2017 2009 DNR [id number]  Marene Lenz, MD Inpatient   05/10/2017 1814 05/11/2017 1500 Full Code [id number]  Marene Lenz, MD ED    Questions for Most Recent Historical Code Status (Order [id number])    Question Answer Comment   In the event of cardiac or respiratory ARREST Do not call a "code blue"    In the event of cardiac or respiratory ARREST Do not perform Intubation, CPR, defibrillation or ACLS    In the event of cardiac or respiratory ARREST Use medication by any route, position, wound care, and other measures to relive pain and suffering. May use oxygen, suction and manual treatment of airway obstruction as needed for comfort.       Home/SNF/Other Nursing Home  Chief Complaint Wound on Bottom  Level of Care/Admitting Diagnosis ED Disposition    ED Disposition Condition [facility] Area: [facility] [100102]  Level of Care: Med-Surg [16]  Diagnosis: Sepsis [facility]) [4401027]  Admitting Physician: Elodia Florence [phone]  Attending Physician: Cephus Slater, A CALDWELL [phone]  PT Class (Do Not Modify): Observation [104]  PT Acc Code (Do Not Modify): Observation [10022]       Medical History Past Medical History:  Diagnosis Date  . Allergy   . Altered mental status 2017  . CKD (chronic kidney disease), stage II    GFR 60s  . Dementia 2017  . Diabetes mellitus without complication ([place])   . Hypertension   . Osteoporosis   . Vitamin D deficiency     Allergies Allergies  Allergen Reactions  . Apple   . Penicillins Hives    IV Location/Drains/Wounds Patient Lines/Drains/Airways Status   Active Line/Drains/Airways    Name:   Placement date:   Placement time:   Site:   Days:   Peripheral IV 06/18/17 Left Wrist    06/18/17    1348    Wrist   less than 1   Peripheral IV 06/18/17 Left Antecubital   06/18/17    1412    Antecubital   less than 1   PICC Single Lumen 05/16/17 PICC Right Brachial 31 cm 0 cm   05/16/17    0915    Brachial   33   Open Drain Midline;Posterior;Other (Comment) Buttock    05/11/17    1055    Buttock   38   External Urinary Catheter   05/10/17    2325    -   39   Airway 7 mm   05/11/17    1009     38   Incision (Closed) 05/11/17 Buttocks Other (Comment)   05/11/17    1047     38   Pressure Injury 05/10/17 Stage III -  Full thickness tissue loss. Subcutaneous fat may be visible but bone, tendon or muscle are NOT exposed.   05/10/17    2100     39   Pressure Injury 05/10/17 Stage II -  Partial thickness loss of dermis presenting as a shallow open ulcer with a red, pink wound bed without slough.   05/10/17    2100     39   Wound / Incision (Open or Dehisced) 05/10/17 Other (Comment) Buttocks Left   05/10/17    2100  Buttocks   39          Labs/Imaging Results for orders placed or performed during the hospital encounter of 06/18/17 (from the past 48 hour(s))  Comprehensive metabolic panel     Status: Abnormal   Collection Time: 06/18/17  1:51 PM  Result Value Ref Range   Sodium 144 135 - 145 mmol/L   Potassium 2.7 (LL) 3.5 - 5.1 mmol/L    Comment: CRITICAL RESULT CALLED TO, READ BACK BY AND VERIFIED WITH: Q.MILNER AT 1500 ON 06/18/17 BY N.THOMPSON    Chloride 106 101 - 111 mmol/L   CO2 27 22 - 32 mmol/L   Glucose, Bld 106 (H) 65 - 99 mg/dL   BUN 12 6 - 20 mg/dL   Creatinine, Ser 1.02 (H) 0.44 - 1.00 mg/dL   Calcium 8.1 (L) 8.9 - 10.3 mg/dL   Total Protein 6.1 (L) 6.5 - 8.1 g/dL   Albumin 2.1 (L) 3.5 - 5.0 g/dL   AST 16 15 - 41 U/L   ALT 15 14 - 54 U/L   Alkaline Phosphatase 46 38 - 126 U/L   Total Bilirubin 0.4 0.3 - 1.2 mg/dL   GFR calc non Af Amer 49 (L) >60 mL/min   GFR calc Af Amer 56 (L) >60 mL/min    Comment: (NOTE) The eGFR has been calculated using the CKD EPI  equation. This calculation has not been validated in all clinical situations. eGFR's persistently <60 mL/min signify possible Chronic Kidney Disease.    Anion gap 11 5 - 15    Comment: Performed at [facility], [place] [address] [place], [place] [address]  CBC with Differential     Status: Abnormal   Collection Time: 06/18/17  1:51 PM  Result Value Ref Range   WBC 15.6 (H) 4.0 - 10.5 K/uL   RBC 2.70 (L) 3.87 - 5.11 MIL/uL   Hemoglobin 7.8 (L) 12.0 - 15.0 g/dL   HCT 23.9 (L) 36.0 - 46.0 %   MCV 88.5 78.0 - 100.0 fL   MCH 28.9 26.0 - 34.0 pg   MCHC 32.6 30.0 - 36.0 g/dL   RDW 15.3 11.5 - 15.5 %   Platelets 501 (H) 150 - 400 K/uL   Neutrophils Relative % 81 %   Neutro Abs 12.6 (H) 1.7 - 7.7 K/uL   Lymphocytes Relative 8 %   Lymphs Abs 1.3 0.7 - 4.0 K/uL   Monocytes Relative 8 %   Monocytes Absolute 1.3 (H) 0.1 - 1.0 K/uL   Eosinophils Relative 3 %   Eosinophils Absolute 0.5 0.0 - 0.7 K/uL   Basophils Relative 0 %   Basophils Absolute 0.0 0.0 - 0.1 K/uL    Comment: Performed at [facility], [place] [address] [place], [place] [address]  Protime-INR     Status: None   Collection Time: 06/18/17  1:51 PM  Result Value Ref Range   Prothrombin Time 14.3 11.4 - 15.2 seconds   INR 1.12     Comment: Performed at [facility], [place] [address] [place], [place] [address]  I-Stat CG4 Lactic Acid, ED     Status: None   Collection Time: 06/18/17  1:53 PM  Result Value Ref Range   Lactic Acid, Venous 0.73 0.5 - 1.9 mmol/L  I-Stat CG4 Lactic Acid, ED  (not at  [facility])     Status: None   Collection Time: 06/18/17  4:26 PM  Result Value Ref Range   Lactic Acid, Venous 0.57 0.5 -  1.9 mmol/L   Dg Chest Port 1 View  Result Date: 06/18/2017 CLINICAL DATA:  84 year old female with fever and decubitus wound. EXAM: PORTABLE CHEST 1 VIEW COMPARISON:  Chest radiographs 12/21/2015. FINDINGS: Portable AP semi upright view at 1408 hours. Lower lung  volumes. Superimposed stable mild elevation of the right hemidiaphragm. Stable cardiac size and mediastinal contours. Borderline to mild cardiomegaly. Visualized tracheal air column is within normal limits. Allowing for portable technique the lungs are clear. No pneumothorax. Stable visualized osseous structures. IMPRESSION: Low lung volumes, otherwise no acute cardiopulmonary abnormality. Electronically Signed   By: Genevie Ann M.D.   On: 06/18/2017 14:27    Pending Labs Unresulted Labs (From admission, onward)   Start     Ordered   06/18/17 1613  Type and screen [place]  Once,   STAT    Comments:  [place]    06/18/17 1612   06/18/17 1352  Urine culture  STAT,   STAT     06/18/17 1352   06/18/17 1328  Culture, blood (Routine x 2)  BLOOD CULTURE X 2,   STAT     06/18/17 1328   06/18/17 1328  Urinalysis, Routine w reflex microscopic  STAT,   STAT     06/18/17 1328      Vitals/Pain Today's Vitals   06/18/17 1630 06/18/17 1652 06/18/17 1730 06/18/17 1741  BP: 108/86 108/86 (!) 109/57   Pulse: 82 93 81   Resp: 19 (!) 22 20   Temp:    (!) 100.4 F (38 C)  TempSrc:    Oral  SpO2: 98% 100% 100%   Weight:      Height:      PainSc:        Isolation Precautions No active isolations  Medications Medications  potassium chloride 10 mEq in 100 mL IVPB (10 mEq Intravenous New Bag/Given 06/18/17 1732)  metroNIDAZOLE (FLAGYL) IVPB 500 mg (has no administration in time range)  metroNIDAZOLE (FLAGYL) IVPB 500 mg (0 mg Intravenous Stopped 06/18/17 1551)  vancomycin (VANCOCIN) IVPB 1000 mg/200 mL premix (0 mg Intravenous Stopped 06/18/17 1553)  aztreonam (AZACTAM) 2 GM IVPB (0 g Intravenous Stopped 06/18/17 1447)  sodium chloride 0.9 % bolus 1,000 mL (0 mLs Intravenous Stopped 06/18/17 1515)    And  sodium chloride 0.9 % bolus 1,000 mL (1,000 mLs Intravenous New Bag/Given 06/18/17 1729)    Mobility non-ambulatory

## 2017-06-18 NOTE — ED Notes (Signed)
ED Provider at bedside. 

## 2017-06-18 NOTE — Progress Notes (Signed)
A consult was received from an ED physician for vanc/aztreonam/flagyl per pharmacy dosing.  The patient's profile has been reviewed for ht/wt/allergies/indication/available labs.   A one time order has been placed for vanc 1g, aztreonam 2g and Flagyl 500mg .  Further antibiotics/pharmacy consults should be ordered by admitting physician if indicated.                       Thank you, Berkley HarveyLegge, Dontez Hauss Marshall 06/18/2017  1:58 PM

## 2017-06-18 NOTE — ED Notes (Signed)
REQUESTED ASSISTANCE FROM [facility] FOR IN AND OUT.

## 2017-06-18 NOTE — ED Notes (Signed)
BLOOD CULTURE X 2 OBTAINED LEFT AC

## 2017-06-19 DIAGNOSIS — G309 Alzheimer's disease, unspecified: Secondary | ICD-10-CM | POA: Diagnosis not present

## 2017-06-19 DIAGNOSIS — E1122 Type 2 diabetes mellitus with diabetic chronic kidney disease: Secondary | ICD-10-CM | POA: Diagnosis not present

## 2017-06-19 DIAGNOSIS — R509 Fever, unspecified: Secondary | ICD-10-CM | POA: Diagnosis present

## 2017-06-19 DIAGNOSIS — L8994 Pressure ulcer of unspecified site, stage 4: Secondary | ICD-10-CM | POA: Diagnosis not present

## 2017-06-19 DIAGNOSIS — Z515 Encounter for palliative care: Secondary | ICD-10-CM | POA: Diagnosis not present

## 2017-06-19 DIAGNOSIS — D649 Anemia, unspecified: Secondary | ICD-10-CM | POA: Diagnosis not present

## 2017-06-19 DIAGNOSIS — Z66 Do not resuscitate: Secondary | ICD-10-CM | POA: Diagnosis not present

## 2017-06-19 DIAGNOSIS — I129 Hypertensive chronic kidney disease with stage 1 through stage 4 chronic kidney disease, or unspecified chronic kidney disease: Secondary | ICD-10-CM | POA: Diagnosis not present

## 2017-06-19 DIAGNOSIS — A419 Sepsis, unspecified organism: Secondary | ICD-10-CM | POA: Diagnosis not present

## 2017-06-19 DIAGNOSIS — E876 Hypokalemia: Secondary | ICD-10-CM | POA: Diagnosis not present

## 2017-06-19 DIAGNOSIS — L89154 Pressure ulcer of sacral region, stage 4: Secondary | ICD-10-CM | POA: Diagnosis not present

## 2017-06-19 DIAGNOSIS — L089 Local infection of the skin and subcutaneous tissue, unspecified: Secondary | ICD-10-CM | POA: Diagnosis not present

## 2017-06-19 DIAGNOSIS — F0280 Dementia in other diseases classified elsewhere without behavioral disturbance: Secondary | ICD-10-CM | POA: Diagnosis not present

## 2017-06-19 DIAGNOSIS — N183 Chronic kidney disease, stage 3 (moderate): Secondary | ICD-10-CM | POA: Diagnosis not present

## 2017-06-19 LAB — BASIC METABOLIC PANEL
ANION GAP: 10 (ref 5–15)
BUN: 11 mg/dL (ref 6–20)
CO2: 24 mmol/L (ref 22–32)
Calcium: 7.5 mg/dL — ABNORMAL LOW (ref 8.9–10.3)
Chloride: 108 mmol/L (ref 101–111)
Creatinine, Ser: 0.91 mg/dL (ref 0.44–1.00)
GFR calc Af Amer: >60 mL/min (ref >60)
GFR, EST NON AFRICAN AMERICAN: 56 mL/min — AB (ref >60)
Glucose, Bld: 102 mg/dL — ABNORMAL HIGH (ref 65–99)
POTASSIUM: 2.9 mmol/L — AB (ref 3.5–5.1)
SODIUM: 142 mmol/L (ref 135–145)

## 2017-06-19 LAB — URINALYSIS, ROUTINE W REFLEX MICROSCOPIC
BILIRUBIN URINE: NEGATIVE
Glucose, UA: NEGATIVE mg/dL
HGB URINE DIPSTICK: NEGATIVE
Ketones, ur: 5 mg/dL — AB
LEUKOCYTES UA: NEGATIVE
NITRITE: NEGATIVE
PROTEIN: 30 mg/dL — AB
Specific Gravity, Urine: 1.02 (ref 1.005–1.030)
pH: 5 (ref 5.0–8.0)

## 2017-06-19 LAB — POTASSIUM: POTASSIUM: 3.7 mmol/L (ref 3.5–5.1)

## 2017-06-19 LAB — CBC
HCT: 23.3 % — ABNORMAL LOW (ref 36.0–46.0)
Hemoglobin: 7.5 g/dL — ABNORMAL LOW (ref 12.0–15.0)
MCH: 28.5 pg (ref 26.0–34.0)
MCHC: 32.2 g/dL (ref 30.0–36.0)
MCV: 88.6 fL (ref 78.0–100.0)
PLATELETS: 409 10*3/uL — AB (ref 150–400)
RBC: 2.63 MIL/uL — AB (ref 3.87–5.11)
RDW: 15.4 % (ref 11.5–15.5)
WBC: 14.6 10*3/uL — AB (ref 4.0–10.5)

## 2017-06-19 LAB — MAGNESIUM: MAGNESIUM: 1.5 mg/dL — AB (ref 1.7–2.4)

## 2017-06-19 LAB — MRSA PCR SCREENING: MRSA by PCR: NEGATIVE

## 2017-06-19 MED ORDER — POTASSIUM CHLORIDE 10 MEQ/100ML IV SOLN
10.0000 meq | INTRAVENOUS | Status: AC
  Administered 2017-06-19 (×2): 10 meq via INTRAVENOUS
  Filled 2017-06-19 (×2): qty 100

## 2017-06-19 MED ORDER — POTASSIUM CHLORIDE CRYS ER 20 MEQ PO TBCR
40.0000 meq | EXTENDED_RELEASE_TABLET | Freq: Two times a day (BID) | ORAL | Status: AC
  Administered 2017-06-19 (×2): 40 meq via ORAL
  Filled 2017-06-19 (×2): qty 2

## 2017-06-19 MED ORDER — MAGNESIUM SULFATE 50 % IJ SOLN
3.0000 g | Freq: Once | INTRAVENOUS | Status: AC
  Administered 2017-06-19: 3 g via INTRAVENOUS
  Filled 2017-06-19: qty 6

## 2017-06-19 MED ORDER — SODIUM CHLORIDE 0.9 % IV SOLN
INTRAVENOUS | Status: DC
  Administered 2017-06-19: 13:00:00 via INTRAVENOUS

## 2017-06-19 NOTE — Evaluation (Signed)
SLP Cancellation Note  Patient Details Name: Caroline Wallace MRN: [id number] DOB: Nov 07, 1930   Cancelled treatment:       Reason Eval/Treat Not Completed: Other (comment)(note per palliative plan is for comfort care, will follow up next date to determine if evaluation desired given care plan. thanks. )   [name], Caroline Rockers Ann 06/19/2017, 4:47 PM   Donavan Burnetamara Vonette Grosso, MS [facility] SLP [phone]

## 2017-06-19 NOTE — Progress Notes (Signed)
PROGRESS NOTE    Caroline Wallace  ONG:[id number] DOB: 27-Jul-1930 DOA: 06/18/2017 PCP: System, Provider Not In    Brief Narrative: 84 year old lady with a past medical history significant for dementia, hypertension, diabetes, infected sacral decubitus ulcer. Patient was hospitalized in March and underwent debridement and drain placement. She was subsequently transitioned from assisted living to skilled nursing facility. She was in the process of transferring back to her assisted living facility when she was brought into the emergency department and has been admitted to hospital medicine service for infected sacral decubitus ulcer. Patient has been started on antibiotics. Palliative consult for goals of care discussions    Assessment & Plan:   Active Problems:   Sepsis (HCC)   Infected decubitus ulcer, stage IV (HCC)   Hypokalemia  Sepsis secondary to sacral decubitus ulcer: in view of her repeated admissions, poor po intake, loss of weight, deconditioning, pt and pts god daughter wanted to pursue comfort measures and residential hospice.  We will stop the IV antibiotics and transition her to comfort care.    Hypokalemia and hypomagnesemia  Replaced.      DVT prophylaxis: comfort care Code Status: DNR Family Communication: discussed with Caroline Wallace.  Disposition Plan: residential hospice.    Consultants:   Palliative care.   Procedures: none.  Antimicrobials: (antibiotics from admission were discontinued.   Subjective: Sleeping comfortably.   Objective: Vitals:   06/18/17 1830 06/18/17 1903 06/18/17 2338 06/19/17 0448  BP: 117/63 (!) 112/58 120/72 117/68  Pulse: 84 80 71 77  Resp: 16 16 (!) 21 20  Temp: 99.9 F (37.7 C) 100 F (37.8 C) 98.6 F (37 C) 98.1 F (36.7 C)  TempSrc: Oral Oral Oral Oral  SpO2: 100% 99% 99% 100%  Weight:      Height:        Intake/Output Summary (Last 24 hours) at 06/19/2017 0916 Last data filed at 06/19/2017 0300 Gross per 24 hour    Intake 3605 ml  Output -  Net 3605 ml   Filed Weights   06/18/17 1330  Weight: 63.5 kg (140 lb)    Examination:  General exam: Appears calm and comfortable  Respiratory system: Clear to auscultation. Respiratory effort normal. Cardiovascular system: S1 & S2 heard, RRR.  Gastrointestinal system: Abdomen is nondistended, soft and nontender.  Central nervous system: sleeping comfortably.  Extremities: no cyanosis or clubbing.  Skin: stage 4 sacral decubitus ulcer.     Data Reviewed: I have personally reviewed following labs and imaging studies  CBC: Recent Labs  Lab 06/18/17 1351 06/19/17 0425  WBC 15.6* 14.6*  NEUTROABS 12.6*  --   HGB 7.8* 7.5*  HCT 23.9* 23.3*  MCV 88.5 88.6  PLT 501* 409*   Basic Metabolic Panel: Recent Labs  Lab 06/18/17 1351 06/19/17 0425  NA 144 142  K 2.7* 2.9*  CL 106 108  CO2 27 24  GLUCOSE 106* 102*  BUN 12 11  CREATININE 1.02* 0.91  CALCIUM 8.1* 7.5*  MG 1.7 1.5*   GFR: Estimated Creatinine Clearance: 39 mL/min (by C-G formula based on SCr of 0.91 mg/dL). Liver Function Tests: Recent Labs  Lab 06/18/17 1351  AST 16  ALT 15  ALKPHOS 46  BILITOT 0.4  PROT 6.1*  ALBUMIN 2.1*   No results for input(s): LIPASE, AMYLASE in the last 168 hours. No results for input(s): AMMONIA in the last 168 hours. Coagulation Profile: Recent Labs  Lab 06/18/17 1351  INR 1.12   Cardiac Enzymes: No results for input(s):  CKTOTAL, CKMB, CKMBINDEX, TROPONINI in the last 168 hours. BNP (last 3 results) No results for input(s): PROBNP in the last 8760 hours. HbA1C: No results for input(s): HGBA1C in the last 72 hours. CBG: No results for input(s): GLUCAP in the last 168 hours. Lipid Profile: No results for input(s): CHOL, HDL, LDLCALC, TRIG, CHOLHDL, LDLDIRECT in the last 72 hours. Thyroid Function Tests: No results for input(s): TSH, T4TOTAL, FREET4, T3FREE, THYROIDAB in the last 72 hours. Anemia Panel: No results for input(s):  VITAMINB12, FOLATE, FERRITIN, TIBC, IRON, RETICCTPCT in the last 72 hours. Sepsis Labs: Recent Labs  Lab 06/18/17 1353 06/18/17 1626  LATICACIDVEN 0.73 0.57    No results found for this or any previous visit (from the past 240 hour(s)).       Radiology Studies: Dg Chest Port 1 View  Result Date: 06/18/2017 CLINICAL DATA:  84 year old female with fever and decubitus wound. EXAM: PORTABLE CHEST 1 VIEW COMPARISON:  Chest radiographs 12/21/2015. FINDINGS: Portable AP semi upright view at 1408 hours. Lower lung volumes. Superimposed stable mild elevation of the right hemidiaphragm. Stable cardiac size and mediastinal contours. Borderline to mild cardiomegaly. Visualized tracheal air column is within normal limits. Allowing for portable technique the lungs are clear. No pneumothorax. Stable visualized osseous structures. IMPRESSION: Low lung volumes, otherwise no acute cardiopulmonary abnormality. Electronically Signed   By: Odessa Fleming M.D.   On: 06/18/2017 14:27        Scheduled Meds: . acetaminophen (TYLENOL) oral liquid 160 mg/5 mL  500 mg Oral BID  . cholecalciferol  1,000 Units Oral Daily  . donepezil  10 mg Oral QHS  . loratadine  10 mg Oral Daily  . memantine  5 mg Oral Daily  . polyethylene glycol  34 g Oral Daily  . potassium chloride  40 mEq Oral Once  . potassium chloride  40 mEq Oral BID  . risperiDONE  0.25 mg Oral BID   Continuous Infusions: . aztreonam Stopped (06/19/17 0645)  . magnesium sulfate 1 - 4 g bolus IVPB    . metronidazole Stopped (06/19/17 1610)  . potassium chloride    . [START ON 06/20/2017] vancomycin       LOS: 0 days    Time spent: 35 minutes    Kathlen Mody, MD [facility] Pager [phone]  If 7PM-7AM, please contact night-coverage [email or web address] Password [facility] 06/19/2017, 9:16 AM

## 2017-06-19 NOTE — Consult Note (Signed)
Consultation Note Date: 06/19/2017   Patient Name: Caroline Wallace  DOB: 1931/01/06  MRN: [id number]  Age / Sex: 84 y.o., female  PCP: System, Provider Not In Referring Physician: Kathlen Mody, MD  Reason for Consultation: Establishing goals of care  HPI/Patient Profile: 84 y.o. female  admitted on 06/18/2017    Clinical Assessment and Goals of Care:  84 year old lady with a past medical history significant for dementia, hypertension, diabetes, infected sacral decubitus ulcer. Patient was hospitalized in March and underwent debridement and drain placement. She was subsequently transitioned from assisted living to skilled nursing facility. She was in the process of transferring back to her assisted living facility when she was brought into the emergency department and has been admitted to hospital medicine service for infected sacral decubitus ulcer. Patient has been started on antibiotics. Palliative consult for goals of care discussions.  Patient is awake and resting in bed. She does not verbalize much. Call placed and subsequently had a family meeting with her goddaughter who is also our colleague in the case management department here at [facility].  Brief life review performed. Ms. Caroline Wallace is described as an independent lady. She was initially living with her goddaughter, subsequently was transitioned to assisted living facility. Patient reportedly has had a gradual progressive decline over the course of the past year. What began as a shearing on her bottom turned into stage IV sacral decubitus ulcer. Patient's memory issues have also worsened. Patient has been noticed to have been pocketing food on multiple occasions. Most recently, her medications have had to be mixed with applesauce. Patient recently had a hospitalization for drain placement and has a large sacral wound.  Patient's oral intake has  declined recently as well. Patient is not able to be ambulatory anymore. Goals wishes and valgus discussed. Discussed about skilled nursing facility, PICC line, IV antibiotics versus full scope of comfort measures. Patient's goddaughter states that the patient would not want to undergo repeated hospitalizations, would not want to undergo PICC line placement and prolonged medical measures. She wishes for comfort measures, residential hospice. See additional discussions/recommendations below. Thank you for the consult.  HCPOA  God daughter Lanier Clam is the designated HCPOA agent, patient has 2 other God children who live in other states, patient has elderly cousins, no spouse, no children of her own.   SUMMARY OF RECOMMENDATIONS    DNR DNI Comfort measures and Residential hospice on discharge CSW consult to help facilitate.   Code Status/Advance Care Planning:  DNR    Symptom Management:    as above   Palliative Prophylaxis:   Delirium Protocol  Additional Recommendations (Limitations, Scope, Preferences):  Full Comfort Care  Psycho-social/Spiritual:   Desire for further Chaplaincy support:yes  Additional Recommendations: Education on Hospice  Prognosis:   < 2 weeks  Discharge Planning: Hospice facility      Primary Diagnoses: Present on Admission: . Sepsis (HCC)   I have reviewed the medical record, interviewed the patient and family, and examined the patient. The following aspects are  pertinent.  Past Medical History:  Diagnosis Date  . Allergy   . Altered mental status 2017  . CKD (chronic kidney disease), stage II    GFR 60s  . Dementia 2017  . Diabetes mellitus without complication (HCC)   . Hypertension   . Osteoporosis   . Vitamin D deficiency    Social History   Socioeconomic History  . Marital status: Widowed    Spouse name: Not on file  . Number of children: Not on file  . Years of education: Not on file  . Highest education level: Not  on file  Occupational History  . Not on file  Social Needs  . Financial resource strain: Not on file  . Food insecurity:    Worry: Not on file    Inability: Not on file  . Transportation needs:    Medical: Not on file    Non-medical: Not on file  Tobacco Use  . Smoking status: Never Smoker  . Smokeless tobacco: Never Used  Substance and Sexual Activity  . Alcohol use: No  . Drug use: No  . Sexual activity: Never  Lifestyle  . Physical activity:    Days per week: Not on file    Minutes per session: Not on file  . Stress: Not on file  Relationships  . Social connections:    Talks on phone: Not on file    Gets together: Not on file    Attends religious service: Not on file    Active member of club or organization: Not on file    Attends meetings of clubs or organizations: Not on file    Relationship status: Not on file  Other Topics Concern  . Not on file  Social History Narrative   Widowed.    [employer].    Lives with her God daughter   HS diploma. Retired Public librariantelephone operator.    No ETOH, Tobacco or drug use.    Wears her seatbelt. Smoke detector in the home.    Family History  Problem Relation Age of Onset  . Lung cancer Mother    Scheduled Meds: . acetaminophen (TYLENOL) oral liquid 160 mg/5 mL  500 mg Oral BID  . cholecalciferol  1,000 Units Oral Daily  . donepezil  10 mg Oral QHS  . loratadine  10 mg Oral Daily  . memantine  5 mg Oral Daily  . polyethylene glycol  34 g Oral Daily  . potassium chloride  40 mEq Oral Once  . potassium chloride  40 mEq Oral BID  . risperiDONE  0.25 mg Oral BID   Continuous Infusions: . sodium chloride 75 mL/hr at 06/19/17 1244  . aztreonam 1 g (06/19/17 1449)  . metronidazole Stopped (06/19/17 16100814)  . [START ON 06/20/2017] vancomycin     PRN Meds:.cyclobenzaprine, ondansetron Medications Prior to Admission:  Prior to Admission medications   Medication Sig Start Date End Date Taking? Authorizing Provider  acetaminophen  (TYLENOL) 500 MG tablet Take 500 mg by mouth 2 (two) times daily.   Yes [provider]  Cholecalciferol (VITAMIN D-1000 MAX ST) 1000 units tablet Take 1,000 Units by mouth daily.   Yes [provider]  cyclobenzaprine (FLEXERIL) 5 MG tablet Take 1 tablet (5 mg total) by mouth 2 (two) times daily as needed for muscle spasms. 11/02/16  Yes Alvira MondaySchlossman, Erin, MD  donepezil (ARICEPT) 10 MG tablet Take 10 mg by mouth at bedtime.   Yes [provider]  lisinopril (PRINIVIL,ZESTRIL) 10 MG tablet Take 1  tablet (10 mg total) by mouth daily. 05/16/17  Yes Ghimire, Werner Lean, MD  loratadine (CLARITIN) 10 MG tablet Take 1 tablet (10 mg total) by mouth daily. 11/25/15  Yes Kuneff, Renee A, DO  memantine (NAMENDA) 5 MG tablet Take 5 mg by mouth daily.   Yes [provider]  ondansetron (ZOFRAN-ODT) 4 MG disintegrating tablet Take 4 mg by mouth every 6 (six) hours as needed for nausea. 02/26/17  Yes [provider]  polyethylene glycol (MIRALAX / GLYCOLAX) packet Take 34 g by mouth daily. 05/16/17  Yes Ghimire, Werner Lean, MD  risperiDONE (RISPERDAL) 0.25 MG tablet Take 0.25 mg by mouth 2 (two) times daily.   Yes [provider]  trolamine salicylate (ASPERCREME) 10 % cream Apply 1 application topically 2 (two) times daily.   Yes [provider]   Allergies  Allergen Reactions  . Apple   . Penicillins Hives   Review of Systems Non verbal with me  Physical Exam Weak elderly lady  Resting in bed Sacral decub pictures noted from POA's phone Regular work of breathing S1 S2 Has edema bilateral LE  Vital Signs: BP (!) 125/58 (BP Location: Right Arm) Comment: informed RN  Pulse 80   Temp 98.1 F (36.7 C) (Axillary)   Resp 16   Ht 5\' 4"  (1.626 m)   Wt 63.5 kg (140 lb)   SpO2 100%   BMI 24.03 kg/m  Pain Scale: 0-10   Pain Score: 0-No pain   SpO2: SpO2: 100 % O2 Device:SpO2: 100 % O2 Flow Rate: .   IO: Intake/output summary:    Intake/Output Summary (Last 24 hours) at 06/19/2017 1521 Last data filed at 06/19/2017 1447 Gross per 24 hour  Intake 2505 ml  Output 350 ml  Net 2155 ml    LBM: Last BM Date: (PTA; patient unable to answer) Baseline Weight: Weight: 63.5 kg (140 lb) Most recent weight: Weight: 63.5 kg (140 lb)     Palliative Assessment/Data:   PPS 20%  Time In:  1400 Time Out:  1510 Time Total: 70 min  Greater than 50%  of this time was spent counseling and coordinating care related to the above assessment and plan.  Signed by: Rosalin Hawking, MD  [phone] Please contact Palliative Medicine Team phone at [phone] for questions and concerns.  For individual provider: See Loretha Stapler

## 2017-06-19 NOTE — Care Management Obs Status (Signed)
MEDICARE OBSERVATION STATUS NOTIFICATION   Patient Details  Name: Caroline JohnsClara J Flannagan MRN: [id number] Date of Birth: [birth year]   Medicare Observation Status Notification Given:  Yes  Signed by POA Lanier ClamKathy Mahabir.   Bartholome BillCLEMENTS, Brunette Lavalle H, RN 06/19/2017, 8:45 AM

## 2017-06-20 DIAGNOSIS — A419 Sepsis, unspecified organism: Secondary | ICD-10-CM | POA: Diagnosis not present

## 2017-06-20 DIAGNOSIS — L8994 Pressure ulcer of unspecified site, stage 4: Secondary | ICD-10-CM

## 2017-06-20 DIAGNOSIS — L089 Local infection of the skin and subcutaneous tissue, unspecified: Secondary | ICD-10-CM | POA: Diagnosis not present

## 2017-06-20 DIAGNOSIS — E876 Hypokalemia: Secondary | ICD-10-CM | POA: Diagnosis not present

## 2017-06-20 LAB — URINE CULTURE: Culture: NO GROWTH

## 2017-06-20 NOTE — Progress Notes (Addendum)
[facility])  Received request from [place] for family interest in [facility]. Chart reviewed and will follow up with family shortly.   Addendum: Met with Juliann Pulse to complete paper work for transfer today. Dr. Tomasa Hosteller to assume care. DC summary has been sent to [facility]. CSW Meghan aware. Appreciate report from [employer].    RN please call report to [phone].   Thank you,  Erling Conte, LCSW [phone]

## 2017-06-20 NOTE — Discharge Summary (Signed)
Discharge Summary  Caroline Wallace:[id number] DOB: [birth year]  PCP: System, Provider Not In  Admit date: 06/18/2017 Discharge date: 06/20/2017  Time spent: 32 mins  Recommendations for Outpatient Follow-up:  1. Hospice for comfort care (palliative team following)  Discharge Diagnoses:  Active Hospital Problems   Diagnosis Date Noted  . Infected decubitus ulcer, stage IV (HCC)   . Hypokalemia   . Sepsis (HCC) 05/10/2017    Resolved Hospital Problems  No resolved problems to display.    Discharge Condition: Stable  Diet recommendation: Regular  Vitals:   06/19/17 2200 06/20/17 0448  BP: (!) 106/58 (!) 111/59  Pulse: 80 77  Resp: 20 20  Temp: 99.6 F (37.6 C) 98.4 F (36.9 C)  SpO2: 98% 98%    History of present illness:  84 year old lady with a past medical history significant for dementia, hypertension, diabetes, infected sacral decubitus ulcer. Patient was hospitalized in March and underwent debridement and drain placement. She was subsequently transitioned from assisted living to skilled nursing facility. She was in the process of transferring back to her assisted living facility when she was brought into the emergency department and has been admitted to hospital medicine service for infected sacral decubitus ulcer. Patient was initially on IV antibiotics, but discontinued as pt is currently DNR/comfort care/hospice with palliative team following.  Today, pt has no new complains and is stable to be transferred to residential hospice.   Hospital Course:  Active Problems:   Sepsis (HCC)   Infected decubitus ulcer, stage IV (HCC)   Hypokalemia  Sepsis secondary to sacral decubitus ulcer stage 4 Due to repeated admissions, poor po intake, loss of weight, deconditioning, pt and pts god daughter chose comfort measures and residential hospice  HTN Meds discontinued  Hypokalemia and hypomagnesemia  Replaced.   Dementia Meds  discontinued   Procedures:  None  Consultations:  Palliative care  Discharge Exam: BP (!) 111/59 (BP Location: Right Arm)   Pulse 77   Temp 98.4 F (36.9 C) (Oral)   Resp 20   Ht 5\' 4"  (1.626 m)   Wt 63.5 kg (140 lb)   SpO2 98%   BMI 24.03 kg/m   General: AA, not oriented, NAD Cardiovascular: S1, S2 present Respiratory: CTAB  Discharge Instructions You were cared for by a hospitalist during your hospital stay. If you have any questions about your discharge medications or the care you received while you were in the hospital after you are discharged, you can call the unit and asked to speak with the hospitalist on call if the hospitalist that took care of you is not available. Once you are discharged, your primary care physician will handle any further medical issues. Please note that NO REFILLS for any discharge medications will be authorized once you are discharged, as it is imperative that you return to your primary care physician (or establish a relationship with a primary care physician if you do not have one) for your aftercare needs so that they can reassess your need for medications and monitor your lab values.  Discharge Instructions    Diet - low sodium heart healthy   Complete by:  As directed    Increase activity slowly   Complete by:  As directed      Allergies as of 06/20/2017      Reactions   Apple    Penicillins Hives      Medication List    STOP taking these medications   donepezil 10 MG tablet Commonly known as:  ARICEPT   lisinopril 10 MG tablet Commonly known as:  PRINIVIL,ZESTRIL   memantine 5 MG tablet Commonly known as:  NAMENDA   polyethylene glycol packet Commonly known as:  MIRALAX / GLYCOLAX   trolamine salicylate 10 % cream Commonly known as:  ASPERCREME   VITAMIN D-1000 MAX ST 1000 units tablet Generic drug:  Cholecalciferol     TAKE these medications   acetaminophen 500 MG tablet Commonly known as:  TYLENOL Take 500 mg by  mouth 2 (two) times daily.   cyclobenzaprine 5 MG tablet Commonly known as:  FLEXERIL Take 1 tablet (5 mg total) by mouth 2 (two) times daily as needed for muscle spasms.   loratadine 10 MG tablet Commonly known as:  CLARITIN Take 1 tablet (10 mg total) by mouth daily.   ondansetron 4 MG disintegrating tablet Commonly known as:  ZOFRAN-ODT Take 4 mg by mouth every 6 (six) hours as needed for nausea.   risperiDONE 0.25 MG tablet Commonly known as:  RISPERDAL Take 0.25 mg by mouth 2 (two) times daily.      Allergies  Allergen Reactions  . Apple   . Penicillins Hives      The results of significant diagnostics from this hospitalization (including imaging, microbiology, ancillary and laboratory) are listed below for reference.    Significant Diagnostic Studies: Dg Chest Port 1 View  Result Date: 06/18/2017 CLINICAL DATA:  84 year old female with fever and decubitus wound. EXAM: PORTABLE CHEST 1 VIEW COMPARISON:  Chest radiographs 12/21/2015. FINDINGS: Portable AP semi upright view at 1408 hours. Lower lung volumes. Superimposed stable mild elevation of the right hemidiaphragm. Stable cardiac size and mediastinal contours. Borderline to mild cardiomegaly. Visualized tracheal air column is within normal limits. Allowing for portable technique the lungs are clear. No pneumothorax. Stable visualized osseous structures. IMPRESSION: Low lung volumes, otherwise no acute cardiopulmonary abnormality. Electronically Signed   By: Odessa FlemingH  Hall M.D.   On: 06/18/2017 14:27    Microbiology: Recent Results (from the past 240 hour(s))  Culture, blood (Routine x 2)     Status: None (Preliminary result)   Collection Time: 06/18/17  1:51 PM  Result Value Ref Range Status   Specimen Description   Final    BLOOD RIGHT ANTECUBITAL Performed at [facility], [address] [place], [place] [address]    Special Requests   Final    BOTTLES DRAWN AEROBIC AND ANAEROBIC Blood Culture  adequate volume Performed at [facility], [address] [place], [place] [address]    Culture   Final    NO GROWTH < 24 HOURS Performed at [facility], [address] [place], [place] [address]    Report Status PENDING  Incomplete  Culture, blood (Routine x 2)     Status: None (Preliminary result)   Collection Time: 06/18/17  2:13 PM  Result Value Ref Range Status   Specimen Description   Final    BLOOD LEFT ANTECUBITAL Performed at [facility], [address] [place], [place] [address]    Special Requests   Final    BOTTLES DRAWN AEROBIC AND ANAEROBIC Blood Culture adequate volume Performed at [facility], [address] [place], [place] [address]    Culture   Final    NO GROWTH < 24 HOURS Performed at [facility], [address] [place], [place] [address]    Report Status PENDING  Incomplete  Urine culture     Status: None  Collection Time: 06/19/17 12:25 AM  Result Value Ref Range Status   Specimen Description   Final    URINE, CLEAN CATCH Performed at [facility], [address] [place], [place] [address]    Special Requests   Final    NONE Performed at [facility], [address], [place], [place] [address]    Culture   Final    NO GROWTH Performed at [facility], [address], [place], [place] [address]    Report Status 06/20/2017 FINAL  Final  MRSA PCR Screening     Status: None   Collection Time: 06/19/17  8:35 AM  Result Value Ref Range Status   MRSA by PCR NEGATIVE NEGATIVE Final    Comment:        The GeneXpert MRSA Assay (FDA approved for NASAL specimens only), is one component of a comprehensive MRSA colonization surveillance program. It is not intended to diagnose MRSA infection nor to guide or monitor treatment for MRSA infections. Performed at [facility], [address], [place], [place]  [address]      Labs: Basic Metabolic Panel: Recent Labs  Lab 06/18/17 1351 06/19/17 0425 06/19/17 1449  NA 144 142  --   K 2.7* 2.9* 3.7  CL 106 108  --   CO2 27 24  --   GLUCOSE 106* 102*  --   BUN 12 11  --   CREATININE 1.02* 0.91  --   CALCIUM 8.1* 7.5*  --   MG 1.7 1.5*  --    Liver Function Tests: Recent Labs  Lab 06/18/17 1351  AST 16  ALT 15  ALKPHOS 46  BILITOT 0.4  PROT 6.1*  ALBUMIN 2.1*   No results for input(s): LIPASE, AMYLASE in the last 168 hours. No results for input(s): AMMONIA in the last 168 hours. CBC: Recent Labs  Lab 06/18/17 1351 06/19/17 0425  WBC 15.6* 14.6*  NEUTROABS 12.6*  --   HGB 7.8* 7.5*  HCT 23.9* 23.3*  MCV 88.5 88.6  PLT 501* 409*   Cardiac Enzymes: No results for input(s): CKTOTAL, CKMB, CKMBINDEX, TROPONINI in the last 168 hours. BNP: BNP (last 3 results) No results for input(s): BNP in the last 8760 hours.  ProBNP (last 3 results) No results for input(s): PROBNP in the last 8760 hours.  CBG: No results for input(s): GLUCAP in the last 168 hours.     Signed:  Briant Cedar, MD [facility] 06/20/2017, 10:25 AM

## 2017-06-20 NOTE — Clinical Social Work Note (Signed)
Clinical Social Work Assessment  Patient Details  Name: Caroline Wallace MRN: [id number] Date of Birth: 1931-02-13  Date of referral:  06/20/17               Reason for consult:  End of Life/Hospice(pt admitted from facility)                Permission sought to share information with:  Family Supports Permission granted to share information::  Yes, Verbal Permission Granted  Name::     goddaughter Animal nutritionist::     Relationship::     Contact Information:     Housing/Transportation Living arrangements for the past 2 months:  Assisted Living Facility(but has been at [facility] for past month) Source of Information:  Power of Forensic psychologist, Designer, fashion/clothing) Patient Interpreter Needed:  None Criminal Activity/Legal Involvement Pertinent to Current Situation/Hospitalization:  No - Comment as needed Significant Relationships:  Warehouse manager, Other Family Members Lives with:  Facility Resident Do you feel safe going back to the place where you live?  Yes Need for family participation in patient care:  Yes (Comment)(pt not interactive)  Care giving concerns:  Pt is a resident of ALF but went to SNF following her last admission to [facility] last month. Had significant wound care and daily care needs. Also has dementia. Pt currently admitted for sepsis secondary to infected ulcer. Pt continues to decline over time despite interventions.   Social Worker assessment / plan:  CSW consulted as pt is admitted from facility- From [place]. Pt known to CSW from previous admissions, and HPOA/goddaughter colleague at [facility]. Pt is sleeping and calm and did not interact with CSW. Goddaughter met with palliative care team and admitting provider yesterday and decided to pursue comfort care for pt as she continues to decline and "this is not quality of life for her." CSW made referral to [facility]- pt screened by admissions and will DC to admit there for residential hospice care  today.  Employment status:  Retired Nurse, adult PT Recommendations:  Not assessed at this time Information / Referral to community resources:     Patient/Family's Response to care:  Appreciative and engaged  Patient/Family's Understanding of and Emotional Response to Diagnosis, Current Treatment, and Prognosis:  Pt unable to demonstrate understanding, has dementia and current medical status declining. Pt's goddaughter demonstrates thorough understanding and emotionally is well-adjusted and reports being at peace with decision to focus on comfort for her godmother.   Emotional Assessment Appearance:  Appears stated age Attitude/Demeanor/Rapport:  (sleeping) Affect (typically observed):  Calm Orientation:  Oriented to Self Alcohol / Substance use:  Not Applicable Psych involvement (Current and /or in the community):  No (Comment)  Discharge Needs  Concerns to be addressed:  Decision making concerns Readmission within the last 30 days:  No Current discharge risk:  Terminally ill Barriers to Discharge:  No Barriers Identified   Nila Nephew, LCSW 06/20/2017, 11:07 AM  [phone]

## 2017-06-23 LAB — CULTURE, BLOOD (ROUTINE X 2)
CULTURE: NO GROWTH
Culture: NO GROWTH
SPECIAL REQUESTS: ADEQUATE
Special Requests: ADEQUATE

## 2017-12-04 DIAGNOSIS — L89153 Pressure ulcer of sacral region, stage 3: Secondary | ICD-10-CM | POA: Diagnosis not present

## 2017-12-04 DIAGNOSIS — L89610 Pressure ulcer of right heel, unstageable: Secondary | ICD-10-CM | POA: Diagnosis not present

## 2017-12-04 DIAGNOSIS — M6281 Muscle weakness (generalized): Secondary | ICD-10-CM | POA: Diagnosis not present

## 2017-12-04 DIAGNOSIS — L89154 Pressure ulcer of sacral region, stage 4: Secondary | ICD-10-CM | POA: Diagnosis not present

## 2017-12-11 DIAGNOSIS — L89153 Pressure ulcer of sacral region, stage 3: Secondary | ICD-10-CM | POA: Diagnosis not present

## 2017-12-11 DIAGNOSIS — L89610 Pressure ulcer of right heel, unstageable: Secondary | ICD-10-CM | POA: Diagnosis not present

## 2017-12-11 DIAGNOSIS — M6281 Muscle weakness (generalized): Secondary | ICD-10-CM | POA: Diagnosis not present

## 2017-12-11 DIAGNOSIS — L89154 Pressure ulcer of sacral region, stage 4: Secondary | ICD-10-CM | POA: Diagnosis not present

## 2017-12-18 DIAGNOSIS — L89610 Pressure ulcer of right heel, unstageable: Secondary | ICD-10-CM | POA: Diagnosis not present

## 2017-12-18 DIAGNOSIS — M6281 Muscle weakness (generalized): Secondary | ICD-10-CM | POA: Diagnosis not present

## 2017-12-18 DIAGNOSIS — L89154 Pressure ulcer of sacral region, stage 4: Secondary | ICD-10-CM | POA: Diagnosis not present

## 2017-12-18 DIAGNOSIS — L89153 Pressure ulcer of sacral region, stage 3: Secondary | ICD-10-CM | POA: Diagnosis not present

## 2017-12-26 DIAGNOSIS — M6281 Muscle weakness (generalized): Secondary | ICD-10-CM | POA: Diagnosis not present

## 2017-12-26 DIAGNOSIS — L89610 Pressure ulcer of right heel, unstageable: Secondary | ICD-10-CM | POA: Diagnosis not present

## 2017-12-26 DIAGNOSIS — L89154 Pressure ulcer of sacral region, stage 4: Secondary | ICD-10-CM | POA: Diagnosis not present

## 2017-12-26 DIAGNOSIS — L89153 Pressure ulcer of sacral region, stage 3: Secondary | ICD-10-CM | POA: Diagnosis not present

## 2018-01-02 DIAGNOSIS — L89153 Pressure ulcer of sacral region, stage 3: Secondary | ICD-10-CM | POA: Diagnosis not present

## 2018-01-02 DIAGNOSIS — L89610 Pressure ulcer of right heel, unstageable: Secondary | ICD-10-CM | POA: Diagnosis not present

## 2018-01-02 DIAGNOSIS — M6281 Muscle weakness (generalized): Secondary | ICD-10-CM | POA: Diagnosis not present

## 2018-01-02 DIAGNOSIS — L89154 Pressure ulcer of sacral region, stage 4: Secondary | ICD-10-CM | POA: Diagnosis not present

## 2018-10-11 DIAGNOSIS — L89154 Pressure ulcer of sacral region, stage 4: Secondary | ICD-10-CM | POA: Diagnosis not present

## 2018-11-04 DIAGNOSIS — L89154 Pressure ulcer of sacral region, stage 4: Secondary | ICD-10-CM | POA: Diagnosis not present

## 2018-12-22 DIAGNOSIS — Z20828 Contact with and (suspected) exposure to other viral communicable diseases: Secondary | ICD-10-CM | POA: Diagnosis not present

## 2018-12-26 DIAGNOSIS — Z20828 Contact with and (suspected) exposure to other viral communicable diseases: Secondary | ICD-10-CM | POA: Diagnosis not present

## 2019-01-09 DIAGNOSIS — Z20828 Contact with and (suspected) exposure to other viral communicable diseases: Secondary | ICD-10-CM | POA: Diagnosis not present

## 2019-01-16 DIAGNOSIS — Z20828 Contact with and (suspected) exposure to other viral communicable diseases: Secondary | ICD-10-CM | POA: Diagnosis not present

## 2019-01-23 DIAGNOSIS — Z20828 Contact with and (suspected) exposure to other viral communicable diseases: Secondary | ICD-10-CM | POA: Diagnosis not present

## 2019-01-28 DIAGNOSIS — Z20828 Contact with and (suspected) exposure to other viral communicable diseases: Secondary | ICD-10-CM | POA: Diagnosis not present

## 2019-02-04 DIAGNOSIS — Z20828 Contact with and (suspected) exposure to other viral communicable diseases: Secondary | ICD-10-CM | POA: Diagnosis not present

## 2019-02-11 DIAGNOSIS — Z20828 Contact with and (suspected) exposure to other viral communicable diseases: Secondary | ICD-10-CM | POA: Diagnosis not present

## 2019-02-18 DIAGNOSIS — Z20828 Contact with and (suspected) exposure to other viral communicable diseases: Secondary | ICD-10-CM | POA: Diagnosis not present

## 2019-02-24 DIAGNOSIS — Z20828 Contact with and (suspected) exposure to other viral communicable diseases: Secondary | ICD-10-CM | POA: Diagnosis not present

## 2019-03-11 DIAGNOSIS — Z20828 Contact with and (suspected) exposure to other viral communicable diseases: Secondary | ICD-10-CM | POA: Diagnosis not present

## 2019-03-18 DIAGNOSIS — Z20828 Contact with and (suspected) exposure to other viral communicable diseases: Secondary | ICD-10-CM | POA: Diagnosis not present

## 2019-03-25 DIAGNOSIS — Z20822 Contact with and (suspected) exposure to covid-19: Secondary | ICD-10-CM | POA: Diagnosis not present

## 2019-03-29 DIAGNOSIS — Z20828 Contact with and (suspected) exposure to other viral communicable diseases: Secondary | ICD-10-CM | POA: Diagnosis not present

## 2019-04-04 DIAGNOSIS — Z20828 Contact with and (suspected) exposure to other viral communicable diseases: Secondary | ICD-10-CM | POA: Diagnosis not present

## 2019-05-05 DEATH — deceased

## 2020-02-10 IMAGING — DX DG CHEST 1V PORT
1 series · 1 of 1 positions shown · non-contrast
Comparison: Chest radiographs 12/21/2015.

CLINICAL DATA: 87-year-old female with fever and decubitus wound.

EXAM:
PORTABLE CHEST 1 VIEW

[chest ap]
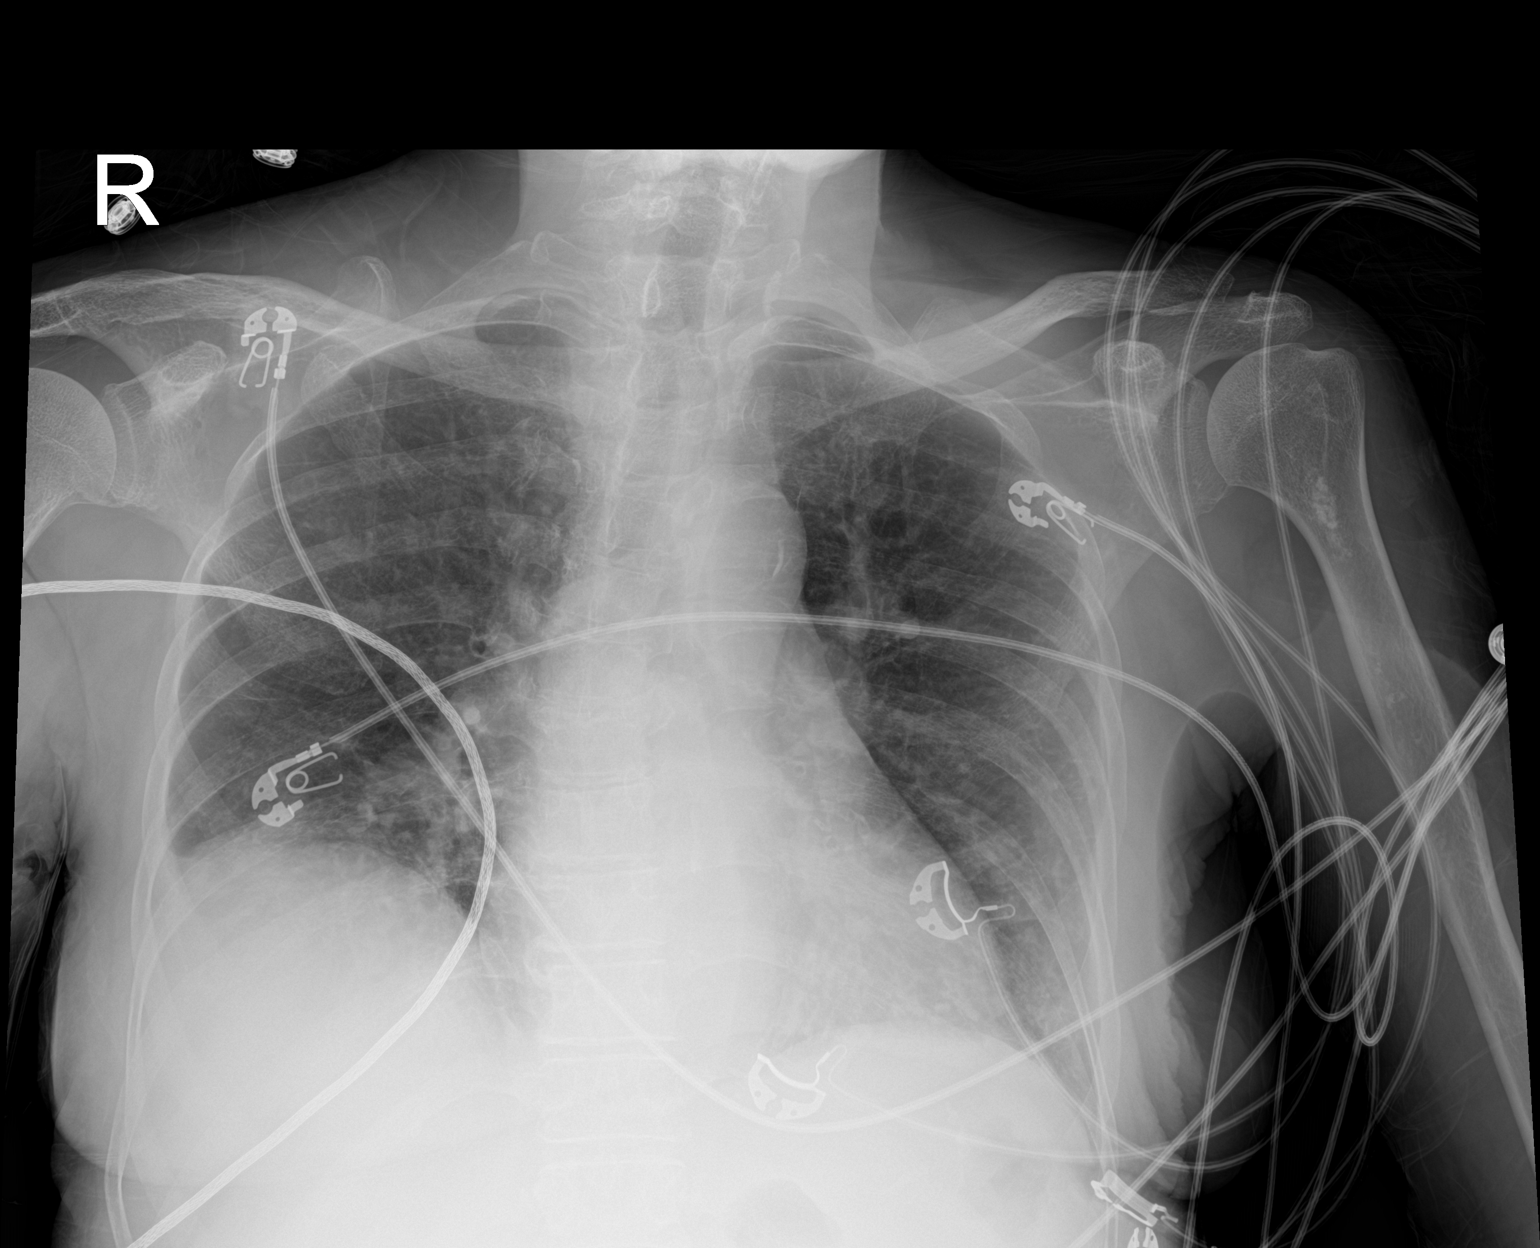

[1 of 1 positions shown; findings below may reference images not displayed]

FINDINGS: Portable AP semi upright view at 8898 hours. Lower lung volumes.
Superimposed stable mild elevation of the right hemidiaphragm.
Stable cardiac size and mediastinal contours. Borderline to mild
cardiomegaly. Visualized tracheal air column is within normal
limits. Allowing for portable technique the lungs are clear. No
pneumothorax. Stable visualized osseous structures.
IMPRESSION: Low lung volumes, otherwise no acute cardiopulmonary abnormality.
